# Patient Record
Sex: Female | Born: 1990 | Hispanic: No | Marital: Single | Smoking: Never smoker
Health system: Southern US, Community
[De-identification: ages and names within clinical notes are randomized; demographics above are authoritative.]

## PROBLEM LIST (undated history)

## (undated) DIAGNOSIS — D649 Anemia, unspecified: Secondary | ICD-10-CM

## (undated) HISTORY — PX: HERNIA REPAIR: SHX51

---
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
## 2013-04-12 ENCOUNTER — Encounter (HOSPITAL_COMMUNITY): Payer: Self-pay | Admitting: Emergency Medicine

## 2013-04-12 ENCOUNTER — Emergency Department (HOSPITAL_COMMUNITY): Payer: BC Managed Care – PPO

## 2013-04-12 ENCOUNTER — Emergency Department (HOSPITAL_COMMUNITY)
Admission: EM | Admit: 2013-04-12 | Discharge: 2013-04-12 | Disposition: A | Discharge status: Home / Self Care | Payer: BC Managed Care – PPO | Attending: Emergency Medicine | Admitting: Emergency Medicine

## 2013-04-12 DIAGNOSIS — R197 Diarrhea, unspecified: Secondary | ICD-10-CM | POA: Insufficient documentation

## 2013-04-12 DIAGNOSIS — R0602 Shortness of breath: Secondary | ICD-10-CM | POA: Insufficient documentation

## 2013-04-12 DIAGNOSIS — M549 Dorsalgia, unspecified: Secondary | ICD-10-CM | POA: Insufficient documentation

## 2013-04-12 DIAGNOSIS — R1033 Periumbilical pain: Secondary | ICD-10-CM | POA: Insufficient documentation

## 2013-04-12 DIAGNOSIS — Z9889 Other specified postprocedural states: Secondary | ICD-10-CM | POA: Insufficient documentation

## 2013-04-12 DIAGNOSIS — R109 Unspecified abdominal pain: Secondary | ICD-10-CM

## 2013-04-12 DIAGNOSIS — Z3202 Encounter for pregnancy test, result negative: Secondary | ICD-10-CM | POA: Insufficient documentation

## 2013-04-12 DIAGNOSIS — R112 Nausea with vomiting, unspecified: Secondary | ICD-10-CM

## 2013-04-12 LAB — COMPREHENSIVE METABOLIC PANEL
Albumin: 4 g/dL (ref 3.5–5.2)
Alkaline Phosphatase: 49 U/L (ref 39–117)
BUN: 9 mg/dL (ref 6–23)
CO2: 26 mEq/L (ref 19–32)
Chloride: 101 mEq/L (ref 96–112)
Creatinine, Ser: 0.69 mg/dL (ref 0.50–1.10)
GFR calc Af Amer: >90 mL/min (ref >90)
GFR calc non Af Amer: >90 mL/min (ref >90)
Glucose, Bld: 115 mg/dL — ABNORMAL HIGH (ref 70–99)
Potassium: 3.6 mEq/L (ref 3.5–5.1)
Total Bilirubin: 0.4 mg/dL (ref 0.3–1.2)

## 2013-04-12 LAB — CBC WITH DIFFERENTIAL/PLATELET
Basophils Relative: 1 % (ref 0–1)
HCT: 36.3 % (ref 36.0–46.0)
Hemoglobin: 13.1 g/dL (ref 12.0–15.0)
Lymphocytes Relative: 24 % (ref 12–46)
Lymphs Abs: 2.1 10*3/uL (ref 0.7–4.0)
MCHC: 36.1 g/dL — ABNORMAL HIGH (ref 30.0–36.0)
Monocytes Absolute: 0.5 10*3/uL (ref 0.1–1.0)
Monocytes Relative: 6 % (ref 3–12)
Neutro Abs: 6 10*3/uL (ref 1.7–7.7)
Neutrophils Relative %: 69 % (ref 43–77)
RBC: 4.64 MIL/uL (ref 3.87–5.11)

## 2013-04-12 LAB — URINALYSIS, ROUTINE W REFLEX MICROSCOPIC
Glucose, UA: NEGATIVE mg/dL
Ketones, ur: NEGATIVE mg/dL
Leukocytes, UA: NEGATIVE
Nitrite: NEGATIVE
Protein, ur: NEGATIVE mg/dL
Specific Gravity, Urine: 1.012 (ref 1.005–1.030)
pH: 8 (ref 5.0–8.0)

## 2013-04-12 LAB — LIPASE, BLOOD: Lipase: 35 U/L (ref 11–59)

## 2013-04-12 MED ORDER — ONDANSETRON HCL 4 MG/2ML IJ SOLN
4.0000 mg | INTRAMUSCULAR | Status: AC
  Administered 2013-04-12: 4 mg via INTRAVENOUS
  Filled 2013-04-12: qty 2

## 2013-04-12 MED ORDER — ONDANSETRON HCL 4 MG PO TABS: 4.0000 mg | ORAL_TABLET | Freq: Four times a day (QID) | ORAL | Status: DC

## 2013-04-12 MED ORDER — IOHEXOL 300 MG/ML  SOLN: 25.0000 mL | Freq: Once | INTRAMUSCULAR | Status: DC | PRN

## 2013-04-12 MED ORDER — IOHEXOL 300 MG/ML  SOLN
100.0000 mL | Freq: Once | INTRAMUSCULAR | Status: AC | PRN
  Administered 2013-04-12: 100 mL via INTRAVENOUS

## 2013-04-12 MED ORDER — SODIUM CHLORIDE 0.9 % IV BOLUS (SEPSIS)
1000.0000 mL | Freq: Once | INTRAVENOUS | Status: AC
  Administered 2013-04-12: 1000 mL via INTRAVENOUS

## 2013-04-12 MED ORDER — MORPHINE SULFATE 4 MG/ML IJ SOLN
4.0000 mg | Freq: Once | INTRAMUSCULAR | Status: AC
  Administered 2013-04-12: 4 mg via INTRAVENOUS
  Filled 2013-04-12: qty 1

## 2013-04-12 MED ORDER — HYDROMORPHONE HCL PF 1 MG/ML IJ SOLN
1.0000 mg | Freq: Once | INTRAMUSCULAR | Status: AC
  Administered 2013-04-12: 1 mg via INTRAVENOUS
  Filled 2013-04-12: qty 1

## 2013-04-12 MED ORDER — HYDROCODONE-ACETAMINOPHEN 5-325 MG PO TABS: 1.0000 | ORAL_TABLET | Freq: Four times a day (QID) | ORAL | Status: DC | PRN

## 2013-04-12 NOTE — Progress Notes (Signed)
Patient with cough following injection of IV contrast for CT examination. I went to see the patient. She was in no acute distress and breathing normally. She stated that she was now feeling as she did prior to contrast injection. Pulse was 75 bpm. No hives identified. 50 mg of Benadryl IV was ordered and the patient was sent back to the ED.

## 2013-04-12 NOTE — ED Notes (Signed)
Pt. States that around 2200 pm her stomach started hurting then she woke up at 0300 with N/V/D. Vomited X 3 since that time. Diarrhea X 3 since that time. Abdomen feels like it is having a burning sensation.

## 2013-04-12 NOTE — ED Notes (Signed)
Pt ambulated to restroom. 

## 2013-04-12 NOTE — ED Notes (Addendum)
I/v D/c 

## 2013-04-12 NOTE — ED Provider Notes (Signed)
CSN: [id number]     Arrival date & time 04/12/13  4098 History   First MD Initiated Contact with Patient 04/12/13 0645     Chief Complaint  Patient presents with  . Abdominal Pain  . Nausea  . Emesis   (Consider location/radiation/quality/duration/timing/severity/associated sxs/prior Treatment) HPI Comments: Patient is a 22 year old female with a history of umbilical hernia repair at age 32 who presents for abdominal pain with onset yesterday. Patient states the pain began periumbilically and is burning and sharp in nature. Patient states the pain has worsened since onset without any modifying factors. Patient awoke 4 hours ago with sudden onset of nausea and nonbloody, nonbilious emesis x3. Patient also admits to 3 episodes of watery, nonbloody diarrhea. Patient states she does feel slightly short of breath, but is without dyspnea or tachypnea on initial presentation. She also c/o associated burning pain across her low back. She denies associated fever, chest pain, urinary symptoms, melena or hematochezia, vaginal complaints, and numbness or tingling. Patient's last menstrual period was 03/20/2013. She denies a history of abdominal surgeries. Patient is not sexually active.  Patient is a 22 y.o. female presenting with abdominal pain and vomiting. The history is provided by the patient. No language interpreter was used.  Abdominal Pain Associated symptoms: diarrhea, nausea, shortness of breath and vomiting   Associated symptoms: no chest pain, no dysuria, no fever, no hematuria, no vaginal bleeding and no vaginal discharge   Emesis Associated symptoms: abdominal pain and diarrhea     History reviewed. No pertinent past medical history. Past Surgical History  Procedure Laterality Date  . Hernia repair     No family history on file. History  Substance Use Topics  . Smoking status: Never Smoker   . Smokeless tobacco: Not on file  . Alcohol Use: No   OB History   Grav Para Term Preterm  Abortions TAB SAB Ect Mult Living                 Review of Systems  Constitutional: Negative for fever.  Respiratory: Positive for shortness of breath.   Cardiovascular: Negative for chest pain.  Gastrointestinal: Positive for nausea, vomiting, abdominal pain and diarrhea.  Genitourinary: Negative for dysuria, hematuria, vaginal bleeding and vaginal discharge.  Musculoskeletal: Positive for back pain.  Neurological: Negative for numbness.  All other systems reviewed and are negative.    Allergies  Iohexol  Home Medications   Current Outpatient Rx  Name  Route  Sig  Dispense  Refill  . OVER THE COUNTER MEDICATION   Oral   Take 1 tablet by mouth daily. Allergy medication         . HYDROcodone-acetaminophen (NORCO/VICODIN) 5-325 MG per tablet   Oral   Take 1 tablet by mouth every 6 (six) hours as needed for pain.   7 tablet   0   . ondansetron (ZOFRAN) 4 MG tablet   Oral   Take 1 tablet (4 mg total) by mouth every 6 (six) hours.   12 tablet   0    BP 146/90  Pulse 63  Temp(Src) 97.4 F (36.3 C) (Oral)  Resp 22  Ht 5' 7.5" (1.715 m)  SpO2 100%  LMP 03/26/2013  Physical Exam  Nursing note and vitals reviewed. Constitutional: She is oriented to person, place, and time. She appears well-developed and well-nourished. No distress.  HENT:  Head: Normocephalic and atraumatic.  Eyes: Conjunctivae and EOM are normal. Pupils are equal, round, and reactive to light. No scleral icterus.  Neck: Normal range of motion.  Cardiovascular: Normal rate, regular rhythm, normal heart sounds and intact distal pulses.   Pulmonary/Chest: Effort normal. No respiratory distress. She has no wheezes. She has no rales.  Abdominal: Soft. Normal appearance. She exhibits no distension, no ascites, no pulsatile midline mass and no mass. There is tenderness in the periumbilical area. There is guarding (voluntary) and tenderness at McBurney's point. There is no rebound and negative Murphy's  sign.    No peritoneal signs or evidence of acute surgical abdomen.  Musculoskeletal: Normal range of motion.  Neurological: She is alert and oriented to person, place, and time.  Skin: Skin is warm and dry. No rash noted. She is not diaphoretic. No erythema. No pallor.  Psychiatric: She has a normal mood and affect. Her behavior is normal.    ED Course  Procedures (including critical care time) Labs Review Labs Reviewed  CBC WITH DIFFERENTIAL - Abnormal; Notable for the following:    MCHC 36.1 (*)    All other components within normal limits  COMPREHENSIVE METABOLIC PANEL - Abnormal; Notable for the following:    Glucose, Bld 115 (*)    All other components within normal limits  URINALYSIS, ROUTINE W REFLEX MICROSCOPIC  LIPASE, BLOOD  POCT PREGNANCY, URINE   Imaging Review Ct Abdomen Pelvis W Contrast  04/12/2013   CLINICAL DATA:  Periumbilical and right lower quadrant abdominal pain.  EXAM: CT ABDOMEN AND PELVIS WITH CONTRAST  TECHNIQUE: Multidetector CT imaging of the abdomen and pelvis was performed using the standard protocol following bolus administration of intravenous contrast.  CONTRAST:  OMNIPAQUE IOHEXOL 300 MG/ML  SOLN.  Patient did experience some coughing and difficulty taking a deep breath following IV contrast injection.  COMPARISON:  None.  FINDINGS: Lung bases are clear.  No pericardial fluid.  No focal hepatic lesion. There are multiple cholesterol gallstones within the gallbladder which extend to the gallbladder neck (image 29). There are least 6 calculi observed measuring from 5-7 mm. Smaller stones may be be occult by CT. No intrahepatic biliary duct dilatation. The common bile duct normal caliber. The pancreas is normal.  The spleen, adrenal glands, and kidneys are normal. The right kidney is normal. 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 No ureterolithiasis.  The stomach, small bowel, and cecum are normal. The appendix is normal. The colon and rectosigmoid colon are  normal.  Abdominal aorta is normal caliber. No retroperitoneal periportal lymphadenopathy.  No free fluid the pelvis. History renal stones or bladder stones. The uterus and ovaries are normal. No pelvic lymphadenopathy. Insert negative then  IMPRESSION: 1. Multiple gallstones within the gallbladder lumen without evidence of acute cholecystitis.  2. Normal appendix.  3.  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   Electronically Signed   By: Genevive Bi M.D.   On: 04/12/2013 09:07    MDM   1. Abdominal pain   2. Nausea vomiting and diarrhea    22 year old female with no significant past medical history presents for abdominal pain with associated nausea, vomiting, and diarrhea. Patient with generalized tenderness on physical exam, but focal tenderness periumbilically and in the right lower quadrant. She is afebrile and hemodynamically stable. No peritoneal signs or evidence of acute surgical abdomen. No symptoms likely viral, will obtain CT abdomen and pelvis to evaluate for appendicitis given focal tenderness on exam. Zofran, morphine, and IV fluids ordered for symptomatic management.  CT scan significant for gallstones without evidence of acute cholecystitis. Normal appendix and adnexa. Patient without leukocytosis, anemia, or electrolyte  imbalance. Liver and kidney function preserved and urinalysis nonsuggestive of infection. Urine pregnancy negative. Have reviewed these findings with the patient who verbalizes understanding. Patient endorses improvement in her abdominal pain since arrival. She has tolerated oral contrast in ED without emesis. Believe symptoms to be associated with a viral illness. Patient is appropriate for discharge with prescriptions for Zofran and Norco to take for symptoms as well as primary care followup. Return precautions discussed with the patient who verbalizes comfort and understanding with this discharge plan.  Antony Madura, PA-C 04/13/13 1652

## 2013-04-12 NOTE — ED Notes (Signed)
Patient transported to CT 

## 2013-04-12 NOTE — ED Notes (Signed)
Pt finished drinking contrast. CT Scan made aware.

## 2013-04-14 NOTE — ED Provider Notes (Signed)
Medical screening examination/treatment/procedure(s) were performed by non-physician practitioner and as supervising physician I was immediately available for consultation/collaboration.  Doug Sou, MD 04/14/13 [phone]

## 2013-12-10 ENCOUNTER — Encounter (INDEPENDENT_AMBULATORY_CARE_PROVIDER_SITE_OTHER): Payer: Self-pay

## 2013-12-10 ENCOUNTER — Ambulatory Visit (INDEPENDENT_AMBULATORY_CARE_PROVIDER_SITE_OTHER): Payer: BC Managed Care – PPO | Admitting: Family Medicine

## 2013-12-10 VITALS — BP 141/93 | HR 84 | Temp 98.0°F | Wt 228.6 lb

## 2013-12-10 DIAGNOSIS — J029 Acute pharyngitis, unspecified: Secondary | ICD-10-CM

## 2013-12-10 DIAGNOSIS — H109 Unspecified conjunctivitis: Secondary | ICD-10-CM

## 2013-12-10 MED ORDER — AMOXICILLIN 875 MG PO TABS: 875.0000 mg | ORAL_TABLET | Freq: Two times a day (BID) | ORAL | Status: DC

## 2013-12-10 MED ORDER — SULFACETAMIDE SODIUM 10 % OP SOLN: 1.0000 [drp] | OPHTHALMIC | Status: DC

## 2013-12-10 NOTE — Progress Notes (Signed)
   Subjective:    Patient ID: Marisa Suarez, female    DOB: [birth year], 23 y.o.   MRN: [id number]  HPI This 23 y.o. female presents for evaluation of sore throat and left eye infection.   Review of Systems C/o left eye infection and sore throat No chest pain, SOB, HA, dizziness, vision change, N/V, diarrhea, constipation, dysuria, urinary urgency or frequency, myalgias, arthralgias or rash.     Objective:   Physical Exam  Vital signs noted  Well developed well nourished female.  HEENT - Head atraumatic Normocephalic                Eyes - PERRLA, Conjuctiva - injected left eye Sclera- injected OS EOMI                Ears - EAC's Wnl TM's Wnl Gross Hearing WNL                Throat - oropharanx injected with exudates Respiratory - Lungs CTA bilateral Cardiac - RRR S1 and S2 without murmur GI - Abdomen soft Nontender and bowel sounds active x 4       Assessment & Plan:  Conjunctivitis - Plan: sulfacetamide (BLEPH-10) 10 % ophthalmic solution  Acute pharyngitis - Plan: amoxicillin (AMOXIL) 875 MG tablet po bid x 10 days #20  Push po fluids, rest, tylenol and motrin otc prn as directed for fever, arthralgias, and myalgias.  Follow up prn if sx's continue or persist.  Deatra Canter FNP

## 2014-09-17 ENCOUNTER — Ambulatory Visit (INDEPENDENT_AMBULATORY_CARE_PROVIDER_SITE_OTHER): Payer: BLUE CROSS/BLUE SHIELD | Admitting: Family

## 2014-09-17 VITALS — BP 144/86 | HR 90 | Temp 97.0°F | Ht 67.5 in | Wt 229.0 lb

## 2014-09-17 DIAGNOSIS — A499 Bacterial infection, unspecified: Secondary | ICD-10-CM | POA: Diagnosis not present

## 2014-09-17 DIAGNOSIS — N9489 Other specified conditions associated with female genital organs and menstrual cycle: Secondary | ICD-10-CM | POA: Diagnosis not present

## 2014-09-17 DIAGNOSIS — B9689 Other specified bacterial agents as the cause of diseases classified elsewhere: Secondary | ICD-10-CM

## 2014-09-17 DIAGNOSIS — N898 Other specified noninflammatory disorders of vagina: Secondary | ICD-10-CM | POA: Diagnosis not present

## 2014-09-17 DIAGNOSIS — N760 Acute vaginitis: Secondary | ICD-10-CM

## 2014-09-17 LAB — POCT WET PREP WITH KOH
TRICHOMONAS UA: NEGATIVE
Yeast Wet Prep HPF POC: NEGATIVE

## 2014-09-17 MED ORDER — METRONIDAZOLE 500 MG PO TABS: 500.0000 mg | ORAL_TABLET | Freq: Two times a day (BID) | ORAL | Status: DC

## 2014-09-17 NOTE — Patient Instructions (Addendum)
Bacterial Vaginosis Bacterial vaginosis is a vaginal infection that occurs when the normal balance of bacteria in the vagina is disrupted. It results from an overgrowth of certain bacteria. This is the most common vaginal infection in women of childbearing age. Treatment is important to prevent complications, especially in pregnant women, as it can cause a premature delivery. CAUSES  Bacterial vaginosis is caused by an increase in harmful bacteria that are normally present in smaller amounts in the vagina. Several different kinds of bacteria can cause bacterial vaginosis. However, the reason that the condition develops is not fully understood. RISK FACTORS Certain activities or behaviors can put you at an increased risk of developing bacterial vaginosis, including:  Having a new sex partner or multiple sex partners.  Douching.  Using an intrauterine device (IUD) for contraception. Women do not get bacterial vaginosis from toilet seats, bedding, swimming pools, or contact with objects around them. SIGNS AND SYMPTOMS  Some women with bacterial vaginosis have no signs or symptoms. Common symptoms include:  Grey vaginal discharge.  A fishlike odor with discharge, especially after sexual intercourse.  Itching or burning of the vagina and vulva.  Burning or pain with urination. DIAGNOSIS  Your health care provider will take a medical history and examine the vagina for signs of bacterial vaginosis. A sample of vaginal fluid may be taken. Your health care provider will look at this sample under a microscope to check for bacteria and abnormal cells. A vaginal pH test may also be done.  TREATMENT  Bacterial vaginosis may be treated with antibiotic medicines. These may be given in the form of a pill or a vaginal cream. A second round of antibiotics may be prescribed if the condition comes back after treatment.  HOME CARE INSTRUCTIONS   Only take over-the-counter or prescription medicines as  directed by your health care provider.  If antibiotic medicine was prescribed, take it as directed. Make sure you finish it even if you start to feel better.  Do not have sex until treatment is completed.  Tell all sexual partners that you have a vaginal infection. They should see their health care provider and be treated if they have problems, such as a mild rash or itching.  Practice safe sex by using condoms and only having one sex partner. SEEK MEDICAL CARE IF:   Your symptoms are not improving after 3 days of treatment.  You have increased discharge or pain.  You have a fever. MAKE SURE YOU:   Understand these instructions.  Will watch your condition.  Will get help right away if you are not doing well or get worse. FOR MORE INFORMATION  Centers for Disease Control and Prevention, Division of STD Prevention: www.cdc.gov/std American Sexual Health Association (ASHA): www.ashastd.org  Document Released: 07/01/2005 Document Revised: 04/21/2013 Document Reviewed: 02/10/2013 Exit[employer] Patient Information 2015 [employer]. This information is not intended to replace advice given to you by your health care provider. Make sure you discuss any questions you have with your health care provider.  

## 2014-09-17 NOTE — Progress Notes (Signed)
   Subjective:    Patient ID: Marisa Suarez, female    DOB: 01/10/1991, 24 y.o.   MRN: [id number]  Vaginal Discharge The patient's primary symptoms include a genital odor and vaginal discharge. The patient's pertinent negatives include no genital itching, genital lesions, genital rash or pelvic pain. This is a new problem. The current episode started in the past 7 days. The problem occurs intermittently. The problem has been waxing and waning. The pain is moderate. Pertinent negatives include no back pain, chills, constipation, dysuria, flank pain, frequency, headaches, hematuria, nausea or sore throat. The vaginal discharge was yellow. There has been no bleeding. She has tried nothing for the symptoms. The treatment provided no relief.      Review of Systems  Constitutional: Negative.  Negative for chills.  HENT: Negative.  Negative for sore throat.   Eyes: Negative.   Respiratory: Negative.  Negative for shortness of breath.   Cardiovascular: Negative.  Negative for palpitations.  Gastrointestinal: Negative.  Negative for nausea and constipation.  Endocrine: Negative.   Genitourinary: Positive for vaginal discharge. Negative for dysuria, frequency, hematuria, flank pain and pelvic pain.  Musculoskeletal: Negative.  Negative for back pain.  Neurological: Negative.  Negative for headaches.  Hematological: Negative.   Psychiatric/Behavioral: Negative.   All other systems reviewed and are negative.      Objective:   Physical Exam  Constitutional: She is oriented to person, place, and time. She appears well-developed and well-nourished. No distress.  HENT:  Head: Normocephalic and atraumatic.  Right Ear: External ear normal.  Mouth/Throat: Oropharynx is clear and moist.  Eyes: Pupils are equal, round, and reactive to light.  Neck: Normal range of motion. Neck supple. No thyromegaly present.  Cardiovascular: Normal rate, regular rhythm, normal heart sounds and intact distal pulses.     No murmur heard. Pulmonary/Chest: Effort normal and breath sounds normal. No respiratory distress. She has no wheezes.  Abdominal: Soft. Bowel sounds are normal. She exhibits no distension. There is no tenderness.  Musculoskeletal: Normal range of motion. She exhibits no edema or tenderness.  Neurological: She is alert and oriented to person, place, and time. She has normal reflexes. No cranial nerve deficit.  Skin: Skin is warm and dry.  Psychiatric: She has a normal mood and affect. Her behavior is normal. Judgment and thought content normal.  Vitals reviewed.    BP 144/86 mmHg  Pulse 90  Temp(Src) 97 F (36.1 C) (Oral)  Ht 5' 7.5" (1.715 m)  Wt 229 lb (103.874 kg)  BMI 35.32 kg/m2  LMP 09/10/2014 (Exact Date)      Assessment & Plan:  1. Vaginal odor - POCT Wet Prep with KOH  2. Vaginal discharge - POCT Wet Prep with KOH  3. BV (bacterial vaginosis) -Keep area clean and dry -Cotton underwear -RTO prn - metroNIDAZOLE (FLAGYL) 500 MG tablet; Take 1 tablet (500 mg total) by mouth 2 (two) times daily.  Dispense: 14 tablet; Refill: 0  Jannifer Rodneyhristy Hawks, FNP

## 2014-12-10 ENCOUNTER — Ambulatory Visit (INDEPENDENT_AMBULATORY_CARE_PROVIDER_SITE_OTHER): Payer: BLUE CROSS/BLUE SHIELD | Admitting: Family Medicine

## 2014-12-10 VITALS — BP 123/83 | HR 74 | Temp 97.3°F | Ht 67.0 in | Wt 230.0 lb

## 2014-12-10 DIAGNOSIS — Z202 Contact with and (suspected) exposure to infections with a predominantly sexual mode of transmission: Secondary | ICD-10-CM | POA: Diagnosis not present

## 2014-12-10 DIAGNOSIS — R300 Dysuria: Secondary | ICD-10-CM

## 2014-12-10 LAB — POCT URINALYSIS DIPSTICK
Bilirubin, UA: NEGATIVE
Blood, UA: NEGATIVE
Glucose, UA: NEGATIVE
KETONES UA: NEGATIVE
Leukocytes, UA: NEGATIVE
Nitrite, UA: NEGATIVE
Spec Grav, UA: >=1.03
UROBILINOGEN UA: NEGATIVE
pH, UA: 5

## 2014-12-10 LAB — POCT UA - MICROSCOPIC ONLY
Casts, Ur, LPF, POC: NEGATIVE
Crystals, Ur, HPF, POC: NEGATIVE
Yeast, UA: NEGATIVE

## 2014-12-10 MED ORDER — CEFTRIAXONE SODIUM 1 G IJ SOLR
1.0000 g | Freq: Once | INTRAMUSCULAR | Status: AC
  Administered 2014-12-10: 1 g via INTRAMUSCULAR

## 2014-12-10 MED ORDER — CIPROFLOXACIN HCL 500 MG PO TABS: 500.0000 mg | ORAL_TABLET | Freq: Two times a day (BID) | ORAL | Status: DC

## 2014-12-10 NOTE — Progress Notes (Signed)
Subjective:  Patient ID: Marisa Suarez, female    DOB: [birth year]  Age: 24 y.o. MRN: [id number]  CC: Vaginal burning   HPI [name] presents for frequent burning sensation for the last week. This has been accompanied by some odor and discharge. She was recently tested for BV and found to have clue cells. This was approximately 3 months ago. She had a short time without symptoms but they have recurred over the last 1-2 weeks. She does have some urinary discomfort but no frequency or urgency.  Sexually she had 1 partner just prior to her original presentation 3 months ago. She has not been sexually active since that time.  History Teyona has no past medical history on file.   She has past surgical history that includes Hernia repair.   Her family history is not on file.She reports that she has never smoked. She does not have any smokeless tobacco history on file. She reports that she does not drink alcohol or use illicit drugs.  Outpatient Prescriptions Prior to Visit  Medication Sig Dispense Refill  . metroNIDAZOLE (FLAGYL) 500 MG tablet Take 1 tablet (500 mg total) by mouth 2 (two) times daily. 14 tablet 0   No facility-administered medications prior to visit.    ROS Review of Systems  Constitutional: Negative for fever, chills, diaphoresis, appetite change and fatigue.  HENT: Negative for congestion, ear pain, hearing loss, postnasal drip, rhinorrhea, sore throat and trouble swallowing.   Respiratory: Negative for cough, chest tightness and shortness of breath.   Cardiovascular: Negative for chest pain and palpitations.  Gastrointestinal: Negative for abdominal pain.  Genitourinary: Positive for dysuria, vaginal discharge and vaginal pain. Negative for frequency, flank pain and genital sores.  Musculoskeletal: Negative for arthralgias.  Skin: Negative for rash.    Objective:  BP 123/83 mmHg  Pulse 74  Temp(Src) 97.3 F (36.3 C) (Oral)  Ht  (1.702 m)  Wt 230 lb  (104.327 kg)  BMI 36.01 kg/m2  BP Readings from Last 3 Encounters:  12/10/14 123/83  09/17/14 144/86  12/10/13 141/93    Wt Readings from Last 3 Encounters:  12/10/14 230 lb (104.327 kg)  09/17/14 229 lb (103.874 kg)  12/10/13 228 lb 9.6 oz (103.692 kg)     Physical Exam  Constitutional: She is oriented to person, place, and time. She appears well-developed and well-nourished. No distress.  HENT:  Head: Normocephalic and atraumatic.  Eyes: Conjunctivae are normal. Pupils are equal, round, and reactive to light.  Neck: Normal range of motion. Neck supple.  Cardiovascular: Normal rate, regular rhythm and normal heart sounds.   No murmur heard. Pulmonary/Chest: Effort normal and breath sounds normal. No respiratory distress. She has no wheezes. She has no rales.  Neurological: She is alert and oriented to person, place, and time. She has normal reflexes.  Skin: Skin is warm and dry. No erythema.  Psychiatric: She has a normal mood and affect. Her behavior is normal. Judgment and thought content normal.  Vitals reviewed.   No results found for: HGBA1C  Lab Results  Component Value Date   WBC 8.8 04/12/2013   HGB 13.1 04/12/2013   HCT 36.3 04/12/2013   PLT 295 04/12/2013   GLUCOSE 115* 04/12/2013   ALT 10 04/12/2013   AST 14 04/12/2013   NA 139 04/12/2013   K 3.6 04/12/2013   CL 101 04/12/2013   CREATININE 0.69 04/12/2013   BUN 9 04/12/2013   CO2 26 04/12/2013   Results for orders placed or  performed in visit on 12/10/14  POCT urinalysis dipstick  Result Value Ref Range   Color, UA drkyellow    Clarity, UA cloudy    Glucose, UA neg    Bilirubin, UA neg    Ketones, UA neg    Spec Grav, UA >=1.030    Blood, UA neg    pH, UA 5.0    Protein, UA trc    Urobilinogen, UA negative    Nitrite, UA neg    Leukocytes, UA Negative   POCT UA - Microscopic Only  Result Value Ref Range   WBC, Ur, HPF, POC occ    RBC, urine, microscopic occ    Bacteria, U Microscopic  many    Mucus, UA mod    Epithelial cells, urine per micros few    Crystals, Ur, HPF, POC neg    Casts, Ur, LPF, POC neg    Yeast, UA neg     Ct Abdomen Pelvis W Contrast  04/12/2013   CLINICAL DATA:  Periumbilical and right lower quadrant abdominal pain.  EXAM: CT ABDOMEN AND PELVIS WITH CONTRAST  TECHNIQUE: Multidetector CT imaging of the abdomen and pelvis was performed using the standard protocol following bolus administration of intravenous contrast.  CONTRAST:  100mL OMNIPAQUE IOHEXOL 300 MG/ML  SOLN.  Patient did experience some coughing and difficulty taking a deep breath following IV contrast injection.  COMPARISON:  None.  FINDINGS: Lung bases are clear.  No pericardial fluid.  No focal hepatic lesion. There are multiple cholesterol gallstones within the gallbladder which extend to the gallbladder neck (image 29). There are least 6 calculi observed measuring from 5-7 mm. Smaller stones may be be occult by CT. No intrahepatic biliary duct dilatation. The common bile duct normal caliber. The pancreas is normal.  The spleen, adrenal glands, and kidneys are normal. The right kidney is normal. 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 No ureterolithiasis.  The stomach, small bowel, and cecum are normal. The appendix is normal. The colon and rectosigmoid colon are normal.  Abdominal aorta is normal caliber. No retroperitoneal periportal lymphadenopathy.  No free fluid the pelvis. History renal stones or bladder stones. The uterus and ovaries are normal. No pelvic lymphadenopathy. Insert negative then  IMPRESSION: 1. Multiple gallstones within the gallbladder lumen without evidence of acute cholecystitis.  2. Normal appendix.  3.  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   Electronically Signed   By: Genevive BiStewart  Edmunds M.D.   On: 04/12/2013 09:07    Assessment & Plan:   Raoul PitchSierra was seen today for vaginal burning.  Diagnoses and all orders for this visit:  Dysuria Orders: -     POCT urinalysis dipstick -      POCT UA - Microscopic Only -     cefTRIAXone (ROCEPHIN) injection 1 g; Inject 1 g into the muscle once. -     GC/Chlamydia Probe Amp -     RPR -     HIV antibody  Potential exposure to STD Orders: -     cefTRIAXone (ROCEPHIN) injection 1 g; Inject 1 g into the muscle once. -     GC/Chlamydia Probe Amp -     RPR -     HIV antibody  Other orders -     ciprofloxacin (CIPRO) 500 MG tablet; Take 1 tablet (500 mg total) by mouth 2 (two) times daily.   I have discontinued Ms. Gonzales's metroNIDAZOLE. I am also having her start on ciprofloxacin. We administered cefTRIAXone.  Meds ordered this encounter  Medications  .  cefTRIAXone (ROCEPHIN) injection 1 g    Sig:   . ciprofloxacin (CIPRO) 500 MG tablet    Sig: Take 1 tablet (500 mg total) by mouth 2 (two) times daily.    Dispense:  20 tablet    Refill:  0     Follow-up: Return if symptoms worsen or fail to improve.  Mechele Claude, M.D.

## 2014-12-11 LAB — RPR: RPR Ser Ql: NONREACTIVE

## 2014-12-11 LAB — HIV ANTIBODY (ROUTINE TESTING W REFLEX): HIV Screen 4th Generation wRfx: NONREACTIVE

## 2014-12-13 LAB — GC/CHLAMYDIA PROBE AMP
Chlamydia trachomatis, NAA: NEGATIVE
Neisseria gonorrhoeae by PCR: NEGATIVE

## 2014-12-15 ENCOUNTER — Encounter (HOSPITAL_COMMUNITY): Payer: Self-pay | Admitting: Emergency Medicine

## 2014-12-15 ENCOUNTER — Emergency Department (HOSPITAL_COMMUNITY)
Admission: EM | Admit: 2014-12-15 | Discharge: 2014-12-15 | Disposition: A | Discharge status: Home / Self Care | Payer: BLUE CROSS/BLUE SHIELD | Attending: Emergency Medicine | Admitting: Emergency Medicine

## 2014-12-15 ENCOUNTER — Emergency Department (HOSPITAL_COMMUNITY): Payer: BLUE CROSS/BLUE SHIELD

## 2014-12-15 DIAGNOSIS — R197 Diarrhea, unspecified: Secondary | ICD-10-CM | POA: Diagnosis not present

## 2014-12-15 DIAGNOSIS — K8020 Calculus of gallbladder without cholecystitis without obstruction: Secondary | ICD-10-CM | POA: Insufficient documentation

## 2014-12-15 DIAGNOSIS — E669 Obesity, unspecified: Secondary | ICD-10-CM | POA: Insufficient documentation

## 2014-12-15 DIAGNOSIS — K8050 Calculus of bile duct without cholangitis or cholecystitis without obstruction: Secondary | ICD-10-CM

## 2014-12-15 DIAGNOSIS — Z3202 Encounter for pregnancy test, result negative: Secondary | ICD-10-CM | POA: Insufficient documentation

## 2014-12-15 DIAGNOSIS — R630 Anorexia: Secondary | ICD-10-CM | POA: Insufficient documentation

## 2014-12-15 DIAGNOSIS — R109 Unspecified abdominal pain: Secondary | ICD-10-CM | POA: Diagnosis present

## 2014-12-15 LAB — CBC WITH DIFFERENTIAL/PLATELET
BASOS PCT: 1 % (ref 0–1)
Basophils Absolute: 0.1 10*3/uL (ref 0.0–0.1)
Eosinophils Absolute: 0.4 10*3/uL (ref 0.0–0.7)
Eosinophils Relative: 5 % (ref 0–5)
HCT: 37.2 % (ref 36.0–46.0)
Hemoglobin: 12.9 g/dL (ref 12.0–15.0)
Lymphocytes Relative: 33 % (ref 12–46)
Lymphs Abs: 2.9 10*3/uL (ref 0.7–4.0)
MCH: 27.8 pg (ref 26.0–34.0)
MCHC: 34.7 g/dL (ref 30.0–36.0)
MCV: 80.2 fL (ref 78.0–100.0)
MONO ABS: 0.5 10*3/uL (ref 0.1–1.0)
Monocytes Relative: 6 % (ref 3–12)
NEUTROS ABS: 4.8 10*3/uL (ref 1.7–7.7)
Neutrophils Relative %: 55 % (ref 43–77)
Platelets: 303 10*3/uL (ref 150–400)
RBC: 4.64 MIL/uL (ref 3.87–5.11)
RDW: 13.3 % (ref 11.5–15.5)
WBC: 8.6 10*3/uL (ref 4.0–10.5)

## 2014-12-15 LAB — URINALYSIS, ROUTINE W REFLEX MICROSCOPIC
BILIRUBIN URINE: NEGATIVE
Glucose, UA: NEGATIVE mg/dL
HGB URINE DIPSTICK: NEGATIVE
Ketones, ur: NEGATIVE mg/dL
LEUKOCYTES UA: NEGATIVE
NITRITE: NEGATIVE
Protein, ur: NEGATIVE mg/dL
Urobilinogen, UA: 0.2 mg/dL (ref 0.0–1.0)
pH: 6 (ref 5.0–8.0)

## 2014-12-15 LAB — COMPREHENSIVE METABOLIC PANEL
ALBUMIN: 4 g/dL (ref 3.5–5.0)
ALT: 15 U/L (ref 14–54)
AST: 17 U/L (ref 15–41)
Alkaline Phosphatase: 46 U/L (ref 38–126)
Anion gap: 10 (ref 5–15)
BUN: 12 mg/dL (ref 6–20)
CALCIUM: 9 mg/dL (ref 8.9–10.3)
CO2: 27 mmol/L (ref 22–32)
CREATININE: 0.77 mg/dL (ref 0.44–1.00)
Chloride: 102 mmol/L (ref 101–111)
GFR calc Af Amer: >60 mL/min (ref >60)
GFR calc non Af Amer: >60 mL/min (ref >60)
Glucose, Bld: 102 mg/dL — ABNORMAL HIGH (ref 65–99)
Potassium: 3.9 mmol/L (ref 3.5–5.1)
SODIUM: 139 mmol/L (ref 135–145)
Total Bilirubin: 0.6 mg/dL (ref 0.3–1.2)
Total Protein: 7.4 g/dL (ref 6.5–8.1)

## 2014-12-15 LAB — PREGNANCY, URINE: Preg Test, Ur: NEGATIVE

## 2014-12-15 LAB — LIPASE, BLOOD: LIPASE: 38 U/L (ref 22–51)

## 2014-12-15 MED ORDER — ONDANSETRON HCL 4 MG/2ML IJ SOLN
4.0000 mg | Freq: Once | INTRAMUSCULAR | Status: AC
  Administered 2014-12-15: 4 mg via INTRAVENOUS
  Filled 2014-12-15: qty 2

## 2014-12-15 MED ORDER — FENTANYL CITRATE (PF) 100 MCG/2ML IJ SOLN
50.0000 ug | Freq: Once | INTRAMUSCULAR | Status: AC
  Administered 2014-12-15: 50 ug via INTRAVENOUS
  Filled 2014-12-15: qty 2

## 2014-12-15 MED ORDER — OXYCODONE-ACETAMINOPHEN 5-325 MG PO TABS
1.0000 | ORAL_TABLET | Freq: Once | ORAL | Status: AC
Start: 2014-12-15 — End: 2014-12-15
  Administered 2014-12-15: 1 via ORAL
  Filled 2014-12-15: qty 1

## 2014-12-15 MED ORDER — SODIUM CHLORIDE 0.9 % IV BOLUS (SEPSIS)
500.0000 mL | Freq: Once | INTRAVENOUS | Status: AC
  Administered 2014-12-15: 500 mL via INTRAVENOUS

## 2014-12-15 NOTE — Discharge Instructions (Signed)
Biliary Colic  °Biliary colic is a steady or irregular pain in the upper abdomen. It is usually under the right side of the rib cage. It happens when gallstones interfere with the normal flow of bile from the gallbladder. Bile is a liquid that helps to digest fats. Bile is made in the liver and stored in the gallbladder. When you eat a meal, bile passes from the gallbladder through the cystic duct and the common bile duct into the small intestine. There, it mixes with partially digested food. If a gallstone blocks either of these ducts, the normal flow of bile is blocked. The muscle cells in the bile duct contract forcefully to try to move the stone. This causes the pain of biliary colic.  °SYMPTOMS  °· A person with biliary colic usually complains of pain in the upper abdomen. This pain can be: °¨ In the center of the upper abdomen just below the breastbone. °¨ In the upper-right part of the abdomen, near the gallbladder and liver. °¨ Spread back toward the right shoulder blade. °· Nausea and vomiting. °· The pain usually occurs after eating. °· Biliary colic is usually triggered by the digestive system's demand for bile. The demand for bile is high after fatty meals. Symptoms can also occur when a person who has been fasting suddenly eats a very large meal. Most episodes of biliary colic pass after 1 to 5 hours. After the most intense pain passes, your abdomen may continue to ache mildly for about 24 hours. °DIAGNOSIS  °After you describe your symptoms, your caregiver will perform a physical exam. He or she will pay attention to the upper right portion of your belly (abdomen). This is the area of your liver and gallbladder. An ultrasound will help your caregiver look for gallstones. Specialized scans of the gallbladder may also be done. Blood tests may be done, especially if you have fever or if your pain persists. °PREVENTION  °Biliary colic can be prevented by controlling the risk factors for gallstones. Some of  these risk factors, such as heredity, increasing age, and pregnancy are a normal part of life. Obesity and a high-fat diet are risk factors you can change through a healthy lifestyle. Women going through menopause who take hormone replacement therapy (estrogen) are also more likely to develop biliary colic. °TREATMENT  °· Pain medication may be prescribed. °· You may be encouraged to eat a fat-free diet. °· If the first episode of biliary colic is severe, or episodes of colic keep retuning, surgery to remove the gallbladder (cholecystectomy) is usually recommended. This procedure can be done through small incisions using an instrument called a laparoscope. The procedure often requires a brief stay in the hospital. Some people can leave the hospital the same day. It is the most widely used treatment in people troubled by painful gallstones. It is effective and safe, with no complications in more than 90% of cases. °· If surgery cannot be done, medication that dissolves gallstones may be used. This medication is expensive and can take months or years to work. Only small stones will dissolve. °· Rarely, medication to dissolve gallstones is combined with a procedure called shock-wave lithotripsy. This procedure uses carefully aimed shock waves to break up gallstones. In many people treated with this procedure, gallstones form again within a few years. °PROGNOSIS  °If gallstones block your cystic duct or common bile duct, you are at risk for repeated episodes of biliary colic. There is also a 25% chance that you will develop   a gallbladder infection(acute cholecystitis), or some other complication of gallstones within 10 to 20 years. If you have surgery, schedule it at a time that is convenient for you and at a time when you are not sick. °HOME CARE INSTRUCTIONS  °· Drink plenty of clear fluids. °· Avoid fatty, greasy or fried foods, or any foods that make your pain worse. °· Take medications as directed. °SEEK MEDICAL  CARE IF:  °· You develop a fever over 100.5° F (38.1° C). °· Your pain gets worse over time. °· You develop nausea that prevents you from eating and drinking. °· You develop vomiting. °SEEK IMMEDIATE MEDICAL CARE IF:  °· You have continuous or severe belly (abdominal) pain which is not relieved with medications. °· You develop nausea and vomiting which is not relieved with medications. °· You have symptoms of biliary colic and you suddenly develop a fever and shaking chills. This may signal cholecystitis. Call your caregiver immediately. °· You develop a yellow color to your skin or the white part of your eyes (jaundice). °Document Released: 12/02/2005 Document Revised: 09/23/2011 Document Reviewed: 02/11/2008 °Exit[employer]® Patient Information ©2015 [employer]. This information is not intended to replace advice given to you by your health care provider. Make sure you discuss any questions you have with your health care provider. ° °

## 2014-12-15 NOTE — ED Notes (Signed)
Pt c/o rt abd pain and has hx of gallstones.

## 2014-12-15 NOTE — ED Provider Notes (Signed)
CSN: [id number]     Arrival date & time 12/15/14  0631 History   First MD Initiated Contact with Patient 12/15/14 0700     Chief Complaint  Patient presents with  . Abdominal Pain     (Consider location/radiation/quality/duration/timing/severity/associated sxs/prior Treatment) Patient is a 24 y.o. female presenting with abdominal pain. The history is provided by the patient.  Abdominal Pain Associated symptoms: diarrhea and nausea   Associated symptoms: no chest pain, no shortness of breath and no vomiting    patient with abdominal pain. Began around 3 in the morning today. Described as cramp initially. His continued. She has had a little bit of diarrhea. Some nausea. No fevers. She states she has known gallstones. She states couple years ago she had similar pain and had a CAT scan done and showed gallstones. She has had no pain episodes like this since. No fevers. She denies possibility of pregnancy. She is not hungry. Pain is dull and constant and somewhat crampy. Worse with movement. The pain has been the upper abdomen all time  History reviewed. No pertinent past medical history. Past Surgical History  Procedure Laterality Date  . Hernia repair     History reviewed. No pertinent family history. History  Substance Use Topics  . Smoking status: Never Smoker   . Smokeless tobacco: Not on file  . Alcohol Use: Yes   OB History    No data available     Review of Systems  Constitutional: Positive for appetite change. Negative for activity change.  Eyes: Negative for pain.  Respiratory: Negative for chest tightness and shortness of breath.   Cardiovascular: Negative for chest pain and leg swelling.  Gastrointestinal: Positive for nausea, abdominal pain and diarrhea. Negative for vomiting.  Genitourinary: Negative for flank pain.  Musculoskeletal: Negative for back pain and neck stiffness.  Skin: Negative for rash.  Neurological: Negative for weakness, numbness and headaches.   Psychiatric/Behavioral: Negative for behavioral problems.      Allergies  Iohexol  Home Medications   Prior to Admission medications   Medication Sig Start Date End Date Taking? Authorizing Provider  ciprofloxacin (CIPRO) 500 MG tablet Take 1 tablet (500 mg total) by mouth 2 (two) times daily. Patient not taking: Reported on 12/15/2014 12/10/14   Mechele ClaudeWarren Stacks, MD   BP 109/67 mmHg  Pulse 76  Temp(Src) 98.1 F (36.7 C) (Oral)  Resp 16  Ht 5\' 7"  (1.702 m)  Wt 220 lb (99.791 kg)  BMI 34.45 kg/m2  SpO2 98%  LMP 11/20/2014 Physical Exam  Constitutional: She appears well-developed.  Patient is obese and appears uncomfortable  Cardiovascular: Normal rate and regular rhythm.   Pulmonary/Chest: Effort normal.  Abdominal: There is tenderness.  Right upper quadrant tenderness without rebound or guarding. No masses.  Genitourinary:  No CVA tenderness  Musculoskeletal: Normal range of motion.  Neurological: She is alert.  Skin: Skin is warm.    ED Course  Procedures (including critical care time) Labs Review Labs Reviewed  COMPREHENSIVE METABOLIC PANEL - Abnormal; Notable for the following:    Glucose, Bld 102 (*)    All other components within normal limits  URINALYSIS, ROUTINE W REFLEX MICROSCOPIC (NOT AT [facility]) - Abnormal; Notable for the following:    Specific Gravity, Urine >1.030 (*)    All other components within normal limits  CBC WITH DIFFERENTIAL/PLATELET  LIPASE, BLOOD  PREGNANCY, URINE    Imaging Review [place] Abdomen Limited  12/15/2014   CLINICAL DATA:  24 year old female with a history of abdominal  pain with nausea.  EXAM: US ABDOMEN LIMITED - RIGHT UPPER QUADRANT  COMPARISON:  CT 04/12/2013  FINDINGS: Gallbladder:  Multiple internal reflectors of the gallbladder with posterior shadowing. No gallbladder wall thickening. No pericholecystic fluid. Negative sonographic Murphy sign, although there is a note made that the patient has received pain medications. Stones are  present throughout the lumen of the gallbladder including the gallbladder neck.  Common bile duct:  Diameter: 2.2 mm  Liver:  No focal lesion identified. Within normal limits in parenchymal echogenicity.  IMPRESSION: Cholelithiasis without sonographic evidence of acute cholecystitis. Sonographic Murphy's sign is negative, though findings may be considered equivocal given that the patient received pain medications. If there is persisting concern for acute cholecystitis correlation with nuclear medicine HIDA study may be considered.  Signed,  Yvone Neu. Loreta Ave, DO  [facility]  [facility]   Electronically Signed   By: Gilmer Mor D.O.   On: 12/15/2014 08:43     EKG Interpretation None      MDM   Final diagnoses:  Biliary colic    Patient with abdominal pain. Has apparent biliary colic. Mild tenderness but lab work overall reassuring area does have stone at the neck of the gallbladder. Discussed with Dr. Lovell Sheehan, who will see the patient follow-up.    Benjiman Core, MD 12/16/14 [phone]

## 2014-12-20 NOTE — Patient Instructions (Signed)
Marisa Suarez  12/20/2014     Your procedure is scheduled on 12/23/14.  [facility] at 07:30  [facility].M.  Call this number if you have problems the morning of surgery:  [phone]   Remember:  Do not eat food or drink liquids after midnight.  Take these medicines the morning of surgery with A SIP OF WATER None   Do not wear jewelry, make-up or nail polish.  Do not wear lotions, powders, or perfumes.  You may wear deodorant.  Do not shave 48 hours prior to surgery.  Men may shave face and neck.  Do not bring valuables to the hospital.  [facility] is not responsible for any belongings or valuables.  Contacts, dentures or bridgework may not be worn into surgery.  Leave your suitcase in the car.  After surgery it may be brought to your room.  For patients admitted to the hospital, discharge time will be determined by your treatment team.  Patients discharged the day of surgery will not be allowed to drive home.   Special instructions:  Shower with Hibiclens (CHG bath) the night before surgery and the morning of surgery.  Please read over the following fact sheets that you were given. Anesthesia Post-op Instructions    Laparoscopic Cholecystectomy Laparoscopic cholecystectomy is surgery to remove the gallbladder. The gallbladder is located in the upper right part of the abdomen, behind the liver. It is a storage sac for bile produced in the liver. Bile aids in the digestion and absorption of fats. Cholecystectomy is often done for inflammation of the gallbladder (cholecystitis). This condition is usually caused by a buildup of gallstones (cholelithiasis) in your gallbladder. Gallstones can block the flow of bile, resulting in inflammation and pain. In severe cases, emergency surgery may be required. When emergency surgery is not required, you will have time to prepare for the procedure. Laparoscopic surgery is an alternative to open surgery. Laparoscopic surgery has a shorter  recovery time. Your common bile duct may also need to be examined during the procedure. If stones are found in the common bile duct, they may be removed. LET [facility] KNOW ABOUT:  Any allergies you have.  All medicines you are taking, including vitamins, herbs, eye drops, creams, and over-the-counter medicines.  Previous problems you or members of your family have had with the use of anesthetics.  Any blood disorders you have.  Previous surgeries you have had.  Medical conditions you have. RISKS AND COMPLICATIONS Generally, this is a safe procedure. However, as with any procedure, complications can occur. Possible complications include:  Infection.  Damage to the common bile duct, nerves, arteries, veins, or other internal organs such as the stomach, liver, or intestines.  Bleeding.  A stone may remain in the common bile duct.  A bile leak from the cyst duct that is clipped when your gallbladder is removed.  The need to convert to open surgery, which requires a larger incision in the abdomen. This may be necessary if your surgeon thinks it is not safe to continue with a laparoscopic procedure. BEFORE THE PROCEDURE  Ask your health care provider about changing or stopping any regular medicines. You will need to stop taking aspirin or blood thinners at least 5 days prior to surgery.  Do not eat or drink anything after midnight the night before surgery.  Let your health care provider know if you develop a cold or other infectious problem before surgery. PROCEDURE   You will be  given medicine to make you sleep through the procedure (general anesthetic). A breathing tube will be placed in your mouth.  When you are asleep, your surgeon will make several small cuts (incisions) in your abdomen.  A thin, lighted tube with a tiny camera on the end (laparoscope) is inserted through one of the small incisions. The camera on the laparoscope sends a picture to a TV screen  in the operating room. This gives the surgeon a good view inside your abdomen.  A gas will be pumped into your abdomen. This expands your abdomen so that the surgeon has more room to perform the surgery.  Other tools needed for the procedure are inserted through the other incisions. The gallbladder is removed through one of the incisions.  After the removal of your gallbladder, the incisions will be closed with stitches, staples, or skin glue. AFTER THE PROCEDURE  You will be taken to a recovery area where your progress will be checked often.  You may be allowed to go home the same day if your pain is controlled and you can tolerate liquids. Document Released: 07/01/2005 Document Revised: 04/21/2013 Document Reviewed: 02/10/2013 [facility] Patient Information 2015 [place], [place]. This information is not intended to replace advice given to you by your health care provider. Make sure you discuss any questions you have with your health care provider.    PATIENT INSTRUCTIONS POST-ANESTHESIA  IMMEDIATELY FOLLOWING SURGERY:  Do not drive or operate machinery for the first twenty four hours after surgery.  Do not make any important decisions for twenty four hours after surgery or while taking narcotic pain medications or sedatives.  If you develop intractable nausea and vomiting or a severe headache please notify your doctor immediately.  FOLLOW-UP:  Please make an appointment with your surgeon as instructed. You do not need to follow up with anesthesia unless specifically instructed to do so.  WOUND CARE INSTRUCTIONS (if applicable):  Keep a dry clean dressing on the anesthesia/puncture wound site if there is drainage.  Once the wound has quit draining you may leave it open to air.  Generally you should leave the bandage intact for twenty four hours unless there is drainage.  If the epidural site drains for more than 36-48 hours please call the anesthesia department.  QUESTIONS?:  Please feel free  to call your physician or the hospital operator if you have any questions, and they will be happy to assist you.

## 2014-12-21 ENCOUNTER — Encounter (HOSPITAL_COMMUNITY): Payer: Self-pay

## 2014-12-21 ENCOUNTER — Encounter (HOSPITAL_COMMUNITY)
Admission: RE | Admit: 2014-12-21 | Discharge: 2014-12-21 | Disposition: A | Discharge status: Home / Self Care | Payer: BLUE CROSS/BLUE SHIELD | Source: Ambulatory Visit | Attending: General Surgery | Admitting: General Surgery

## 2014-12-21 HISTORY — DX: Anemia, unspecified: D64.9

## 2014-12-21 NOTE — H&P (Signed)
  NTS SOAP Note  Vital Signs:  Vitals as of: 12/20/2014: Systolic 134: Diastolic 77: Heart Rate 75: Temp 98.56F: Height 575ft 7in: Weight 233Lbs 0 Ounces: Pain Level 3: BMI 36.49  BMI : 36.49 kg/m2  Subjective: This 24 year old female presents for of gallstones.  Has had multiple episodes of right upper quadrant abdominal pain, nausea, and fatty food intolerance over the past few years.  Recently seen in the ER.  U/S of gallbladder reveals cholelithiasis, normal common bile duct.  No fever, chills, jaundice.  Review of Symptoms:  Constitutional:unremarkable   Head:unremarkable Eyes:unremarkable   Nose/Mouth/Throat:unremarkable Cardiovascular:  unremarkable Respiratory:unremarkable Gastrointestinal:    abdominal pain, nausea Genitourinary:unremarkable   Musculoskeletal:unremarkable Skin:unremarkable Hematolgic/Lymphatic:unremarkable   Allergic/Immunologic:unremarkable   Past Medical History:  Reviewed  Past Medical History  Surgical History: umbilical herniorrhaphy as an infant Medical Problems: none Allergies: shellfish, iohexol Medications: none   Social History:Reviewed  Social History  Preferred Language: English Race:  Black or African American Ethnicity: Not Hispanic / Latino Age: 24 year Marital Status:  S Alcohol: socially   Smoking Status: Never smoker reviewed on 12/20/2014 Functional Status reviewed on 12/20/2014 ------------------------------------------------ Bathing: Normal Cooking: Normal Dressing: Normal Driving: Normal Eating: Normal Managing Meds: Normal Oral Care: Normal Shopping: Normal Toileting: Normal Transferring: Normal Walking: Normal Cognitive Status reviewed on 12/20/2014 ------------------------------------------------ Attention: Normal Decision Making: Normal Language: Normal Memory: Normal Motor: Normal Perception: Normal Problem Solving: Normal Visual and Spatial: Normal   Family  History:Reviewed  Family Health History Family History is Unknown    Objective Information: General:Well appearing, well nourished in no distress. no scleral icterus Heart:RRR, no murmur or gallop.  Normal S1, S2.  No S3, S4.  Lungs:  CTA bilaterally, no wheezes, rhonchi, rales.  Breathing unlabored. Abdomen:Soft, mildly tender int the right upper quadrant to palpation, ND, no HSM, no masses. LFT's negative Assessment:Cholelithiasis, biliary colic  Diagnoses: 574.20  K80.20 Gallstone (Calculus of gallbladder without cholecystitis without obstruction)  Procedures: [id number] - OFFICE OUTPATIENT NEW 30 MINUTES    Plan:  Scheduled for laparoscopic cholecystectomy on 12/23/14.   Patient Education:Alternative treatments to surgery were discussed with patient (and family).  Risks and benefits  of procedure including bleeding, infection, hepatobiliary injury, and the possibility of an open procedure were fully explained to the patient (and family) who gave informed consent. Patient/family questions were addressed.  Follow-up:Pending Surgery

## 2014-12-23 ENCOUNTER — Encounter (HOSPITAL_COMMUNITY): Payer: Self-pay | Admitting: *Deleted

## 2014-12-23 ENCOUNTER — Ambulatory Visit (HOSPITAL_COMMUNITY)
Admission: RE | Admit: 2014-12-23 | Discharge: 2014-12-23 | Disposition: A | Discharge status: Home / Self Care | Payer: BLUE CROSS/BLUE SHIELD | Source: Ambulatory Visit | Attending: General Surgery | Admitting: General Surgery

## 2014-12-23 ENCOUNTER — Ambulatory Visit (HOSPITAL_COMMUNITY): Payer: BLUE CROSS/BLUE SHIELD | Admitting: Anesthesiology

## 2014-12-23 ENCOUNTER — Encounter (HOSPITAL_COMMUNITY)
Admission: RE | Disposition: A | Discharge status: Home / Self Care | Payer: Self-pay | Source: Ambulatory Visit | Attending: General Surgery

## 2014-12-23 DIAGNOSIS — K8012 Calculus of gallbladder with acute and chronic cholecystitis without obstruction: Secondary | ICD-10-CM | POA: Insufficient documentation

## 2014-12-23 DIAGNOSIS — K8020 Calculus of gallbladder without cholecystitis without obstruction: Secondary | ICD-10-CM | POA: Diagnosis present

## 2014-12-23 DIAGNOSIS — Z6834 Body mass index (BMI) 34.0-34.9, adult: Secondary | ICD-10-CM | POA: Diagnosis not present

## 2014-12-23 HISTORY — PX: CHOLECYSTECTOMY: SHX55

## 2014-12-23 SURGERY — LAPAROSCOPIC CHOLECYSTECTOMY
Anesthesia: General

## 2014-12-23 MED ORDER — ONDANSETRON HCL 4 MG/2ML IJ SOLN
4.0000 mg | Freq: Once | INTRAMUSCULAR | Status: AC
  Administered 2014-12-23: 4 mg via INTRAVENOUS

## 2014-12-23 MED ORDER — PROPOFOL 10 MG/ML IV BOLUS
INTRAVENOUS | Status: AC
  Filled 2014-12-23: qty 20

## 2014-12-23 MED ORDER — LACTATED RINGERS IV SOLN
INTRAVENOUS | Status: DC
  Administered 2014-12-23 (×2): via INTRAVENOUS

## 2014-12-23 MED ORDER — FENTANYL CITRATE (PF) 100 MCG/2ML IJ SOLN
INTRAMUSCULAR | Status: AC
  Filled 2014-12-23: qty 2

## 2014-12-23 MED ORDER — ROCURONIUM BROMIDE 100 MG/10ML IV SOLN
INTRAVENOUS | Status: DC | PRN
  Administered 2014-12-23: 40 mg via INTRAVENOUS

## 2014-12-23 MED ORDER — OXYCODONE-ACETAMINOPHEN 7.5-325 MG PO TABS: 1.0000 | ORAL_TABLET | ORAL | Status: DC | PRN

## 2014-12-23 MED ORDER — POVIDONE-IODINE 10 % EX OINT
TOPICAL_OINTMENT | CUTANEOUS | Status: AC
  Filled 2014-12-23: qty 1

## 2014-12-23 MED ORDER — GLYCOPYRROLATE 0.2 MG/ML IJ SOLN
INTRAMUSCULAR | Status: DC | PRN
  Administered 2014-12-23: 0.4 mg via INTRAVENOUS

## 2014-12-23 MED ORDER — KETOROLAC TROMETHAMINE 30 MG/ML IJ SOLN
INTRAMUSCULAR | Status: AC
  Filled 2014-12-23: qty 1

## 2014-12-23 MED ORDER — MIDAZOLAM HCL 5 MG/5ML IJ SOLN
INTRAMUSCULAR | Status: DC | PRN
  Administered 2014-12-23: 2 mg via INTRAVENOUS

## 2014-12-23 MED ORDER — KETOROLAC TROMETHAMINE 30 MG/ML IJ SOLN
30.0000 mg | Freq: Once | INTRAMUSCULAR | Status: AC
  Administered 2014-12-23: 30 mg via INTRAVENOUS

## 2014-12-23 MED ORDER — GLYCOPYRROLATE 0.2 MG/ML IJ SOLN
INTRAMUSCULAR | Status: AC
  Filled 2014-12-23: qty 2

## 2014-12-23 MED ORDER — FENTANYL CITRATE (PF) 100 MCG/2ML IJ SOLN
25.0000 ug | INTRAMUSCULAR | Status: DC | PRN
  Administered 2014-12-23: 50 ug via INTRAVENOUS
  Administered 2014-12-23 (×2): 25 ug via INTRAVENOUS
  Administered 2014-12-23 (×2): 50 ug via INTRAVENOUS

## 2014-12-23 MED ORDER — CIPROFLOXACIN IN D5W 400 MG/200ML IV SOLN
400.0000 mg | INTRAVENOUS | Status: AC
  Administered 2014-12-23: 400 mg via INTRAVENOUS
  Filled 2014-12-23: qty 200

## 2014-12-23 MED ORDER — FENTANYL CITRATE (PF) 250 MCG/5ML IJ SOLN
INTRAMUSCULAR | Status: AC
  Filled 2014-12-23: qty 5

## 2014-12-23 MED ORDER — MIDAZOLAM HCL 2 MG/2ML IJ SOLN
INTRAMUSCULAR | Status: AC
  Filled 2014-12-23: qty 2

## 2014-12-23 MED ORDER — ONDANSETRON HCL 4 MG/2ML IJ SOLN
4.0000 mg | Freq: Once | INTRAMUSCULAR | Status: AC | PRN
  Administered 2014-12-23: 4 mg via INTRAVENOUS
  Filled 2014-12-23: qty 2

## 2014-12-23 MED ORDER — POVIDONE-IODINE 10 % OINT PACKET
TOPICAL_OINTMENT | CUTANEOUS | Status: DC | PRN
  Administered 2014-12-23: 1 via TOPICAL

## 2014-12-23 MED ORDER — LIDOCAINE HCL 1 % IJ SOLN
INTRAMUSCULAR | Status: DC | PRN
  Administered 2014-12-23: 35 mg via INTRADERMAL

## 2014-12-23 MED ORDER — HEMOSTATIC AGENTS (NO CHARGE) OPTIME
TOPICAL | Status: DC | PRN
  Administered 2014-12-23: 2 via TOPICAL

## 2014-12-23 MED ORDER — CHLORHEXIDINE GLUCONATE 4 % EX LIQD: 1.0000 "application " | Freq: Once | CUTANEOUS | Status: DC

## 2014-12-23 MED ORDER — ONDANSETRON HCL 4 MG/2ML IJ SOLN
INTRAMUSCULAR | Status: AC
  Filled 2014-12-23: qty 2

## 2014-12-23 MED ORDER — BUPIVACAINE HCL (PF) 0.5 % IJ SOLN
INTRAMUSCULAR | Status: DC | PRN
  Administered 2014-12-23: 10 mL

## 2014-12-23 MED ORDER — PROPOFOL 10 MG/ML IV BOLUS
INTRAVENOUS | Status: DC | PRN
  Administered 2014-12-23: 150 mg via INTRAVENOUS

## 2014-12-23 MED ORDER — FENTANYL CITRATE (PF) 100 MCG/2ML IJ SOLN
INTRAMUSCULAR | Status: AC
Start: 2014-12-23 — End: 2014-12-23
  Filled 2014-12-23: qty 2

## 2014-12-23 MED ORDER — BUPIVACAINE HCL (PF) 0.5 % IJ SOLN
INTRAMUSCULAR | Status: AC
  Filled 2014-12-23: qty 30

## 2014-12-23 MED ORDER — FENTANYL CITRATE (PF) 100 MCG/2ML IJ SOLN
INTRAMUSCULAR | Status: DC | PRN
Start: 2014-12-23 — End: 2014-12-23
  Administered 2014-12-23: 100 ug via INTRAVENOUS
  Administered 2014-12-23 (×3): 50 ug via INTRAVENOUS

## 2014-12-23 MED ORDER — MIDAZOLAM HCL 2 MG/2ML IJ SOLN
1.0000 mg | INTRAMUSCULAR | Status: DC | PRN
  Administered 2014-12-23 (×2): 2 mg via INTRAVENOUS
  Filled 2014-12-23: qty 2

## 2014-12-23 MED ORDER — SODIUM CHLORIDE 0.9 % IR SOLN
Status: DC | PRN
  Administered 2014-12-23: 1000 mL

## 2014-12-23 MED ORDER — NEOSTIGMINE METHYLSULFATE 10 MG/10ML IV SOLN
INTRAVENOUS | Status: DC | PRN
  Administered 2014-12-23: 2 mg via INTRAVENOUS
  Administered 2014-12-23: 1 mg via INTRAVENOUS

## 2014-12-23 SURGICAL SUPPLY — 40 items
APPLIER CLIP LAPSCP 10X32 DD (CLIP) ×2 IMPLANT
BAG HAMPER (MISCELLANEOUS) ×2 IMPLANT
BAG SPEC RTRVL LRG 6X4 10 (ENDOMECHANICALS) ×1
CHLORAPREP W/TINT 26ML (MISCELLANEOUS) ×2 IMPLANT
CLOTH BEACON ORANGE TIMEOUT ST (SAFETY) ×2 IMPLANT
COVER LIGHT HANDLE STERIS (MISCELLANEOUS) ×4 IMPLANT
DECANTER SPIKE VIAL GLASS SM (MISCELLANEOUS) ×2 IMPLANT
ELECT REM PT RETURN 9FT ADLT (ELECTROSURGICAL) ×2
ELECTRODE REM PT RTRN 9FT ADLT (ELECTROSURGICAL) ×1 IMPLANT
FILTER SMOKE EVAC LAPAROSHD (FILTER) ×2 IMPLANT
FORMALIN 10 PREFIL 120ML (MISCELLANEOUS) ×2 IMPLANT
GLOVE BIOGEL PI IND STRL 7.0 (GLOVE) IMPLANT
GLOVE BIOGEL PI INDICATOR 7.0 (GLOVE) ×3
GLOVE ECLIPSE 6.5 STRL STRAW (GLOVE) ×1 IMPLANT
GLOVE SURG SS PI 7.5 STRL IVOR (GLOVE) ×3 IMPLANT
GOWN STRL REUS W/ TWL XL LVL3 (GOWN DISPOSABLE) ×1 IMPLANT
GOWN STRL REUS W/TWL LRG LVL3 (GOWN DISPOSABLE) ×4 IMPLANT
GOWN STRL REUS W/TWL XL LVL3 (GOWN DISPOSABLE) ×2
HEMOSTAT SNOW SURGICEL 2X4 (HEMOSTASIS) ×3 IMPLANT
INST SET LAPROSCOPIC AP (KITS) ×2 IMPLANT
KIT ROOM TURNOVER APOR (KITS) ×2 IMPLANT
MANIFOLD NEPTUNE II (INSTRUMENTS) ×2 IMPLANT
NDL INSUFFLATION 14GA 120MM (NEEDLE) ×1 IMPLANT
NEEDLE INSUFFLATION 14GA 120MM (NEEDLE) ×2 IMPLANT
NS IRRIG 1000ML POUR BTL (IV SOLUTION) ×2 IMPLANT
PACK LAP CHOLE LZT030E (CUSTOM PROCEDURE TRAY) ×2 IMPLANT
PAD ARMBOARD 7.5X6 YLW CONV (MISCELLANEOUS) ×2 IMPLANT
POUCH SPECIMEN RETRIEVAL 10MM (ENDOMECHANICALS) ×2 IMPLANT
SET BASIN LINEN APH (SET/KITS/TRAYS/PACK) ×2 IMPLANT
SLEEVE ENDOPATH XCEL 5M (ENDOMECHANICALS) ×2 IMPLANT
SPONGE GAUZE 2X2 8PLY STRL LF (GAUZE/BANDAGES/DRESSINGS) ×8 IMPLANT
STAPLER VISISTAT (STAPLE) ×2 IMPLANT
SUT VICRYL 0 UR6 27IN ABS (SUTURE) ×2 IMPLANT
TAPE CLOTH SURG 4X10 WHT LF (GAUZE/BANDAGES/DRESSINGS) ×1 IMPLANT
TROCAR ENDO BLADELESS 11MM (ENDOMECHANICALS) ×2 IMPLANT
TROCAR XCEL NON-BLD 5MMX100MML (ENDOMECHANICALS) ×2 IMPLANT
TROCAR XCEL UNIV SLVE 11M 100M (ENDOMECHANICALS) ×2 IMPLANT
TUBING INSUFFLATION (TUBING) ×2 IMPLANT
WARMER LAPAROSCOPE (MISCELLANEOUS) ×2 IMPLANT
YANKAUER SUCT 12FT TUBE ARGYLE (SUCTIONS) ×2 IMPLANT

## 2014-12-23 NOTE — Discharge Instructions (Signed)

## 2014-12-23 NOTE — Interval H&P Note (Signed)
History and Physical Interval Note:  12/23/2014 9:28 AM  Marisa Suarez  has presented today for surgery, with the diagnosis of cholelithiasis  The various methods of treatment have been discussed with the patient and family. After consideration of risks, benefits and other options for treatment, the patient has consented to  Procedure(s): LAPAROSCOPIC CHOLECYSTECTOMY (N/A) as a surgical intervention .  The patient's history has been reviewed, patient examined, no change in status, stable for surgery.  I have reviewed the patient's chart and labs.  Questions were answered to the patient's satisfaction.     Franky Macho A

## 2014-12-23 NOTE — Transfer of Care (Signed)
Immediate Anesthesia Transfer of Care Note  Patient: Marisa Suarez  Procedure(s) Performed: Procedure(s): LAPAROSCOPIC CHOLECYSTECTOMY (N/A)  Patient Location: PACU  Anesthesia Type:General  Level of Consciousness: awake, alert  and patient cooperative  Airway & Oxygen Therapy: Patient Spontanous Breathing and Patient connected to face mask oxygen  Post-op Assessment: Report given to RN, Post -op Vital signs reviewed and stable and Patient moving all extremities  Post vital signs: Reviewed and stable  Last Vitals:  Filed Vitals:   12/23/14 0950  BP: 117/77  Pulse:   Temp:   Resp: 25    Complications: No apparent anesthesia complications

## 2014-12-23 NOTE — Op Note (Signed)
Patient:  Marisa Suarez  DOB:  Nov 22, 1990  MRN:  [id number]   Preop Diagnosis:  Cholecystitis, cholelithiasis  Postop Diagnosis:  Same  Procedure:  Laparoscopic cholecystectomy  Surgeon:  Franky Macho, M.D.  Anes:  Gen. endotracheal  Indications:  Patient is a 24 year old black female who presents with cholecystitis secondary to cholelithiasis. The risks and benefits of the procedure including bleeding, infection, hepatobiliary injury, and the possibility of an open procedure were fully explained to the patient, who gave informed consent.  Procedure note:  The patient is placed the supine position. After induction of general endotracheal anesthesia, the abdomen was prepped and draped using usual sterile technique with DuraPrep. Surgical site confirmation was performed.  The supraumbilical incision was made down to the fascia. A Veress needle was introduced into the abdominal cavity and confirmation of placement was done using the saline drop test. The abdomen was then insufflated to 16 mmHg pressure. An 11 mm trocar was introduced into the abdominal cavity under direct visualization without difficulty. The patient was placed in reverse Trendelenburg position and additional 11 mm trocar was placed the epigastric region and 5 mm trochars were placed in the right upper quadrant and right flank regions. The liver was inspected and noted to be within normal limits. The gallbladder was retracted in a dynamic fashion in order to expose the triangle of Calot. The cystic duct was first identified. Its junction to the infundibulum was fully identified. Endoclips were placed proximally and distally on the cystic duct, and the cystic duct was divided. This was likewise done cystic artery. The gallbladder was freed away from the gallbladder fossa using electrocautery. The gallbladder was delivered trocar site using an Endo Catch bag. The gallbladder fossa was inspected no abnormal bleeding or bile leakage was  noted. Surgicel is placed the gallbladder fossa. All fluid and air were then evacuated from the abdominal cavity prior to removal of the trochars.  All wounds were irrigated with normal saline. All wounds were injected with 0.5% Sensorcaine. The epigastric fascia as well as supraumbilical fascia were reapproximated using 0 Vicryl interrupted sutures. All skin incisions were closed using staples. Betadine ointment and dry sterile dressings were applied.  All tape and needle counts were correct at the end of the procedure. The patient was extubated in the operating room and transferred to PACU in stable condition.  Complications:  None  EBL:  Minimal  Specimen:  Gallbladder

## 2014-12-23 NOTE — Anesthesia Postprocedure Evaluation (Signed)
  Anesthesia Post-op Note  Patient: Marisa Suarez  Procedure(s) Performed: Procedure(s): LAPAROSCOPIC CHOLECYSTECTOMY (N/A)  Patient Location: PACU  Anesthesia Type:General  Level of Consciousness: awake, alert , oriented and patient cooperative  Airway and Oxygen Therapy: Patient Spontanous Breathing  Post-op Pain: 3 /10, mild  Post-op Assessment: Post-op Vital signs reviewed, Patient's Cardiovascular Status Stable, Respiratory Function Stable and Patent Airway              Post-op Vital Signs: Reviewed and stable  Last Vitals:  Filed Vitals:   12/23/14 1100  BP: 124/60  Pulse:   Temp: 37.4 C  Resp:     Complications: No apparent anesthesia complications

## 2014-12-23 NOTE — Anesthesia Procedure Notes (Signed)
Procedure Name: Intubation Date/Time: 12/23/2014 10:08 AM Performed by: Despina Hidden Pre-anesthesia Checklist: Patient being monitored, Suction available, Emergency Drugs available and Patient identified Patient Re-evaluated:Patient Re-evaluated prior to inductionOxygen Delivery Method: Circle system utilized Preoxygenation: Pre-oxygenation with 100% oxygen Intubation Type: IV induction Ventilation: Mask ventilation without difficulty and Oral airway inserted - appropriate to patient size Laryngoscope Size: Mac and 3 Grade View: Grade I Tube size: 7.0 mm Number of attempts: 1 Airway Equipment and Method: Stylet and Oral airway Placement Confirmation: ETT inserted through vocal cords under direct vision,  positive ETCO2 and breath sounds checked- equal and bilateral Secured at: 22 cm Tube secured with: Tape Dental Injury: Teeth and Oropharynx as per pre-operative assessment

## 2014-12-23 NOTE — Anesthesia Preprocedure Evaluation (Signed)
Anesthesia Evaluation  Patient identified by MRN, date of birth, ID band Patient awake    Reviewed: Allergy & Precautions, NPO status , Patient's Chart, lab work & pertinent test results  Airway Mallampati: I  TM Distance: >3 FB     Dental  (+) Teeth Intact, Dental Advisory Given   Pulmonary neg pulmonary ROS,  breath sounds clear to auscultation        Cardiovascular negative cardio ROS  Rhythm:Regular Rate:Normal     Neuro/Psych    GI/Hepatic negative GI ROS,   Endo/Other  Morbid obesity  Renal/GU      Musculoskeletal   Abdominal   Peds  Hematology  (+) anemia ,   Anesthesia Other Findings   Reproductive/Obstetrics                             Anesthesia Physical Anesthesia Plan  ASA: II  Anesthesia Plan: General   Post-op Pain Management:    Induction: Intravenous  Airway Management Planned: Oral ETT  Additional Equipment:   Intra-op Plan:   Post-operative Plan: Extubation in OR  Informed Consent: I have reviewed the patients History and Physical, chart, labs and discussed the procedure including the risks, benefits and alternatives for the proposed anesthesia with the patient or authorized representative who has indicated his/her understanding and acceptance.     Plan Discussed with:   Anesthesia Plan Comments:         Anesthesia Quick Evaluation

## 2014-12-26 ENCOUNTER — Encounter (HOSPITAL_COMMUNITY): Payer: Self-pay | Admitting: General Surgery

## 2016-08-12 ENCOUNTER — Ambulatory Visit (INDEPENDENT_AMBULATORY_CARE_PROVIDER_SITE_OTHER): Payer: 59 | Admitting: Physician Assistant

## 2016-08-12 ENCOUNTER — Encounter: Payer: Self-pay | Admitting: Physician Assistant

## 2016-08-12 VITALS — BP 115/72 | HR 76 | Temp 97.7°F | Ht 67.0 in | Wt 242.0 lb

## 2016-08-12 DIAGNOSIS — Z30015 Encounter for initial prescription of vaginal ring hormonal contraceptive: Secondary | ICD-10-CM | POA: Insufficient documentation

## 2016-08-12 MED ORDER — ETONOGESTREL-ETHINYL ESTRADIOL 0.12-0.015 MG/24HR VA RING
VAGINAL_RING | VAGINAL | 3 refills | Status: DC
Start: 2016-08-12 — End: 2016-10-22

## 2016-08-12 NOTE — Patient Instructions (Signed)
Estradiol vaginal ring (Estring) What is this medicine? ESTRADIOL (es tra DYE ole) vaginal ring is an insert that contains a female hormone. This medicine helps relieve symptoms of vaginal irritation and dryness that occurs in some women during menopause. This medicine may be used for other purposes; ask your health care provider or pharmacist if you have questions. COMMON BRAND NAME(S): Estring What should I tell my health care provider before I take this medicine? They need to know if you have any of these conditions: -abnormal vaginal bleeding -blood vessel disease or blood clots -breast, cervical, endometrial, ovarian, liver, or uterine cancer -dementia -diabetes -gallbladder disease -heart disease or recent heart attack -high blood pressure -high cholesterol -high level of calcium in the blood -hysterectomy -kidney disease -liver disease -migraine headaches -protein C deficiency -protein S deficiency -stroke -systemic lupus erythematosus (SLE) -tobacco smoker -an unusual or allergic reaction to estrogens, other hormones, medicines, foods, dyes, or preservatives -pregnant or trying to get pregnant -breast-feeding How should I use this medicine? This medicine may be inserted by you or your physician. Follow the directions that are included with your prescription. If you are unsure how to insert the ring, contact your doctor or health care professional. The vaginal ring should remain in place for 90 days. After 90 days you should replace your old ring and insert a new one. Do not stop using except on the advice of your doctor or health care professional. Contact your pediatrician regarding the use of this medicine in children. Special care may be needed. A patient package insert for the product will be given with each prescription and refill. Read this sheet carefully each time. The sheet may change frequently. Overdosage: If you think you have taken too much of this medicine  contact a poison control center or emergency room at once. NOTE: This medicine is only for you. Do not share this medicine with others. What if I miss a dose? If you miss a dose, use it as soon as you can. If it is almost time for your next dose, use only that dose. Do not use double or extra doses. What may interact with this medicine? Do not take this medicine with any of the following medications: -aromatase inhibitors like aminoglutethimide, anastrozole, exemestane, letrozole, testolactone, vorozole This medicine may also interact with the following medications: -carbamazepine -certain antibiotics used to treat infections -certain barbiturates used for inducing sleep or treating seizures -grapefruit juice -medicines for fungus infections like itraconazole and ketoconazole -raloxifene or tamoxifen -rifabutin, rifampin, or rifapentine -ritonavir -St. John's Wort This list may not describe all possible interactions. Give your health care provider a list of all the medicines, herbs, non-prescription drugs, or dietary supplements you use. Also tell them if you smoke, drink alcohol, or use illegal drugs. Some items may interact with your medicine. What should I watch for while using this medicine? Visit your doctor or health care professional for regular checks on your progress. You will need a regular breast and pelvic exam and Pap smear while on this medicine. You should also discuss the need for regular mammograms with your health care professional, and follow his or her guidelines for these tests. This medicine can make your body retain fluid, making your fingers, hands, or ankles swell. Your blood pressure can go up. Contact your doctor or health care professional if you feel you are retaining fluid. If you have any reason to think you are pregnant, stop taking this medicine right away and contact your doctor or health  care professional. Smoking increases the risk of getting a blood clot or  having a stroke while you are taking this medicine, especially if you are more than 24 years old. You are strongly advised not to smoke. If you wear contact lenses and notice visual changes, or if the lenses begin to feel uncomfortable, consult your eye doctor or health care professional. This medicine can increase the risk of developing a condition (endometrial hyperplasia) that may lead to cancer of the lining of the uterus. Taking progestins, another hormone drug, with this medicine lowers the risk of developing this condition. Therefore, if your uterus has not been removed (by a hysterectomy), your doctor may prescribe a progestin for you to take together with your estrogen. You should know, however, that taking estrogens with progestins may have additional health risks. You should discuss the use of estrogens and progestins with your health care professional to determine the benefits and risks for you. If you are going to have surgery, you may need to stop taking this medicine. Consult your health care professional for advice before you schedule the surgery. You may bathe or participate in other activities while using this medicine. You do not need to remove the vaginal ring during sexual or other activities unless you are more comfortable doing so. Within the 90-day dosage period, you may remove the vaginal ring, rinse it with clean lukewarm (not hot or boiling) water, and re-insert the ring as needed. What side effects may I notice from receiving this medicine? Side effects that you should report to your doctor or health care professional as soon as possible: -allergic reactions like skin rash, itching or hives, swelling of the face, lips, or tongue -breast tissue changes or discharge -signs and symptoms of a blood clot such as breathing problems; changes in vision; chest pain; severe, sudden headache; pain, swelling, warmth in the leg; trouble speaking; sudden numbness or weakness of the face, arm  or leg -signs and symptoms of infection like fever or chills; vomiting; diarrhea; muscle pain; dizziness; or a red, sunburn-like rash on face and body -signs and symptoms of liver injury like dark yellow or brown urine; general ill feeling or flu-like symptoms; light-colored stools; loss of appetite; nausea; right upper belly pain; unusually weak or tired; yellowing of the eyes or skin -symptoms of bowel blockage like constipation, abdominal swelling, abdominal pain, inability to pass gas or have a bowel movement -symptoms of vaginal infection like itching, irritation or unusual discharge -unusual or increased vaginal bleeding -vaginal pain or soreness, redness, swelling Side effects that usually do not require medical attention (report to your doctor or health care professional if they continue or are bothersome): -breast tenderness -fluid retention -hair loss -headache -nausea -upset stomach -vaginal spotting This list may not describe all possible side effects. Call your doctor for medical advice about side effects. You may report side effects to FDA at 1-800-FDA-1088. Where should I keep my medicine? Keep out of the reach of children. Store at room temperature between 15 and 25 degrees C (59 and 77 degrees F). Throw away any unused medicine after the expiration date. NOTE: This sheet is a summary. It may not cover all possible information. If you have questions about this medicine, talk to your doctor, pharmacist, or health care provider.  2017 [employer] Standard (2014-04-25 13:20:25)

## 2016-08-12 NOTE — Progress Notes (Signed)
BP 115/72   Pulse 76   Temp 97.7 F (36.5 C) (Oral)   Ht 5\' 7"  (1.702 m)   Wt 242 lb (109.8 kg)   LMP 08/06/2016   BMI 37.90 kg/m    Subjective:    Patient ID: Marisa Suarez, female    DOB: 07/04/1991, 26 y.o.   MRN: [id number]  HPI: Marisa Suarez is a 26 y.o. female presenting on 08/12/2016 for Discuss Birth Control  Patient has been reading about all the contraceptive methods. She has ruled out oral birth control pills because of the very difficult schedule. She is not sure that she can take it at approximately the same time every day due to her work schedule requirements. She is interested in a contraceptive ring or an IUD. She is not interested in Depo-Provera or Implanon.  She is concerned about weight gain on the Provera type medications. Her last Pap smear was at age 66. I have told her that we need to get this scheduled in the next couple months. At this time we can start her NuvaRing and recheck her in 2 months and plan the Pap smear at that time. However we can discuss if the liver ring is effective enough and if she does okay with it. If not we'll plan for an IUD. However she has not had a child and so therefore will probably need a referral to the gynecologist for an implantation of that if warranted  Relevant past medical, surgical, family and social history reviewed and updated as indicated. Allergies and medications reviewed and updated.  Past Medical History:  Diagnosis Date  . Anemia     Past Surgical History:  Procedure Laterality Date  . CHOLECYSTECTOMY N/A 12/23/2014   Procedure: LAPAROSCOPIC CHOLECYSTECTOMY;  Surgeon: Franky Macho Md, MD;  Location: [facility];  Service: General;  Laterality: N/A;  . HERNIA REPAIR     umbilical    Review of Systems  Constitutional: Negative.  Negative for activity change, fatigue and fever.  HENT: Negative.   Eyes: Negative.   Respiratory: Negative.  Negative for cough.   Cardiovascular: Negative.  Negative for chest pain.    Gastrointestinal: Negative.  Negative for abdominal pain.  Endocrine: Negative.   Genitourinary: Negative.  Negative for dysuria.  Musculoskeletal: Negative.   Skin: Negative.   Neurological: Negative.     Allergies as of 08/12/2016      Reactions   Iohexol Cough   Patient began coughing after injection of contrast and had difficulty taking in a deep breath      Medication List       Accurate as of 08/12/16  2:05 PM. Always use your most recent med list.          etonogestrel-ethinyl estradiol 0.12-0.015 MG/24HR vaginal ring Commonly known as:  NUVARING Insert vaginally and leave in place for 3 consecutive weeks, then remove for 1 week.          Objective:    BP 115/72   Pulse 76   Temp 97.7 F (36.5 C) (Oral)   Ht 5\' 7"  (1.702 m)   Wt 242 lb (109.8 kg)   LMP 08/06/2016   BMI 37.90 kg/m   Allergies  Allergen Reactions  . Iohexol Cough    Patient began coughing after injection of contrast and had difficulty taking in a deep breath    Physical Exam  Constitutional: She is oriented to person, place, and time. She appears well-developed and well-nourished.  HENT:  Head: Normocephalic and atraumatic.  Eyes: Conjunctivae and EOM are normal. Pupils are equal, round, and reactive to light.  Cardiovascular: Normal rate, regular rhythm, normal heart sounds and intact distal pulses.   Pulmonary/Chest: Effort normal and breath sounds normal.  Abdominal: Soft. Bowel sounds are normal.  Neurological: She is alert and oriented to person, place, and time. She has normal reflexes.  Skin: Skin is warm and dry. No rash noted.  Psychiatric: She has a normal mood and affect. Her behavior is normal. Judgment and thought content normal.        Assessment & Plan:   1. Encounter for initial prescription of vaginal ring hormonal contraceptive - etonogestrel-ethinyl estradiol (NUVARING) 0.12-0.015 MG/24HR vaginal ring; Insert vaginally and leave in place for 3 consecutive weeks,  then remove for 1 week.  Dispense: 1 each; Refill: 3   Continue all other maintenance medications as listed above.  Follow up plan: Return in about 2 months (around 10/10/2016) for female physical.  Educational handout given for nuvaring  Remus LofflerAngel S. Karlita Lichtman PA-C [facility] [address]  [place], [place] [id number] [phone]   08/12/2016, 2:05 PM

## 2016-10-22 ENCOUNTER — Encounter: Payer: Self-pay | Admitting: Physician Assistant

## 2016-10-22 ENCOUNTER — Ambulatory Visit (INDEPENDENT_AMBULATORY_CARE_PROVIDER_SITE_OTHER): Payer: 59 | Admitting: Physician Assistant

## 2016-10-22 VITALS — BP 125/81 | HR 85 | Temp 97.0°F | Ht 67.0 in | Wt 241.0 lb

## 2016-10-22 DIAGNOSIS — Z01419 Encounter for gynecological examination (general) (routine) without abnormal findings: Secondary | ICD-10-CM

## 2016-10-22 DIAGNOSIS — Z0000 Encounter for general adult medical examination without abnormal findings: Secondary | ICD-10-CM | POA: Diagnosis not present

## 2016-10-22 DIAGNOSIS — Z3044 Encounter for surveillance of vaginal ring hormonal contraceptive device: Secondary | ICD-10-CM

## 2016-10-22 MED ORDER — ETONOGESTREL-ETHINYL ESTRADIOL 0.12-0.015 MG/24HR VA RING: VAGINAL_RING | VAGINAL | 12 refills | Status: AC

## 2016-10-22 NOTE — Patient Instructions (Signed)
Health Maintenance, Female Adopting a healthy lifestyle and getting preventive care can go a long way to promote health and wellness. Talk with your health care provider about what schedule of regular examinations is right for you. This is a good chance for you to check in with your provider about disease prevention and staying healthy. In between checkups, there are plenty of things you can do on your own. Experts have done a lot of research about which lifestyle changes and preventive measures are most likely to keep you healthy. Ask your health care provider for more information. Weight and diet Eat a healthy diet  Be sure to include plenty of vegetables, fruits, low-fat dairy products, and lean protein.  Do not eat a lot of foods high in solid fats, added sugars, or salt.  Get regular exercise. This is one of the most important things you can do for your health.  Most adults should exercise for at least 150 minutes each week. The exercise should increase your heart rate and make you sweat (moderate-intensity exercise).  Most adults should also do strengthening exercises at least twice a week. This is in addition to the moderate-intensity exercise. Maintain a healthy weight  Body mass index (BMI) is a measurement that can be used to identify possible weight problems. It estimates body fat based on height and weight. Your health care provider can help determine your BMI and help you achieve or maintain a healthy weight.  For females 76 years of age and older:  A BMI below 18.5 is considered underweight.  A BMI of 18.5 to 24.9 is normal.  A BMI of 25 to 29.9 is considered overweight.  A BMI of 30 and above is considered obese. Watch levels of cholesterol and blood lipids  You should start having your blood tested for lipids and cholesterol at 24 years of age, then have this test every 5 years.  You may need to have your cholesterol levels checked more often if:  Your lipid or  cholesterol levels are high.  You are older than 24 years of age.  You are at high risk for heart disease. Cancer screening Lung Cancer  Lung cancer screening is recommended for adults 64-42 years old who are at high risk for lung cancer because of a history of smoking.  A yearly low-dose CT scan of the lungs is recommended for people who:  Currently smoke.  Have quit within the past 15 years.  Have at least a 30-pack-year history of smoking. A pack year is smoking an average of one pack of cigarettes a day for 1 year.  Yearly screening should continue until it has been 15 years since you quit.  Yearly screening should stop if you develop a health problem that would prevent you from having lung cancer treatment. Breast Cancer  Practice breast self-awareness. This means understanding how your breasts normally appear and feel.  It also means doing regular breast self-exams. Let your health care provider know about any changes, no matter how small.  If you are in your 20s or 30s, you should have a clinical breast exam (CBE) by a health care provider every 1-3 years as part of a regular health exam.  If you are 34 or older, have a CBE every year. Also consider having a breast X-ray (mammogram) every year.  If you have a family history of breast cancer, talk to your health care provider about genetic screening.  If you are at high risk for breast cancer, talk  to your health care provider about having an MRI and a mammogram every year.  Breast cancer gene (BRCA) assessment is recommended for women who have family members with BRCA-related cancers. BRCA-related cancers include:  Breast.  Ovarian.  Tubal.  Peritoneal cancers.  Results of the assessment will determine the need for genetic counseling and BRCA1 and BRCA2 testing. Cervical Cancer  Your health care provider may recommend that you be screened regularly for cancer of the pelvic organs (ovaries, uterus, and vagina).  This screening involves a pelvic examination, including checking for microscopic changes to the surface of your cervix (Pap test). You may be encouraged to have this screening done every 3 years, beginning at age 24.  For women ages 66-65, health care providers may recommend pelvic exams and Pap testing every 3 years, or they may recommend the Pap and pelvic exam, combined with testing for human papilloma virus (HPV), every 5 years. Some types of HPV increase your risk of cervical cancer. Testing for HPV may also be done on women of any age with unclear Pap test results.  Other health care providers may not recommend any screening for nonpregnant women who are considered low risk for pelvic cancer and who do not have symptoms. Ask your health care provider if a screening pelvic exam is right for you.  If you have had past treatment for cervical cancer or a condition that could lead to cancer, you need Pap tests and screening for cancer for at least 20 years after your treatment. If Pap tests have been discontinued, your risk factors (such as having a new sexual partner) need to be reassessed to determine if screening should resume. Some women have medical problems that increase the chance of getting cervical cancer. In these cases, your health care provider may recommend more frequent screening and Pap tests. Colorectal Cancer  This type of cancer can be detected and often prevented.  Routine colorectal cancer screening usually begins at 24 years of age and continues through 24 years of age.  Your health care provider may recommend screening at an earlier age if you have risk factors for colon cancer.  Your health care provider may also recommend using home test kits to check for hidden blood in the stool.  A small camera at the end of a tube can be used to examine your colon directly (sigmoidoscopy or colonoscopy). This is done to check for the earliest forms of colorectal cancer.  Routine  screening usually begins at age 41.  Direct examination of the colon should be repeated every 5-10 years through 24 years of age. However, you may need to be screened more often if early forms of precancerous polyps or small growths are found. Skin Cancer  Check your skin from head to toe regularly.  Tell your health care provider about any new moles or changes in moles, especially if there is a change in a mole's shape or color.  Also tell your health care provider if you have a mole that is larger than the size of a pencil eraser.  Always use sunscreen. Apply sunscreen liberally and repeatedly throughout the day.  Protect yourself by wearing long sleeves, pants, a wide-brimmed hat, and sunglasses whenever you are outside. Heart disease, diabetes, and high blood pressure  High blood pressure causes heart disease and increases the risk of stroke. High blood pressure is more likely to develop in:  People who have blood pressure in the high end of the normal range (130-139/85-89 mm Hg).  People who are overweight or obese.  People who are African American.  If you are 59-24 years of age, have your blood pressure checked every 3-5 years. If you are 34 years of age or older, have your blood pressure checked every year. You should have your blood pressure measured twice-once when you are at a hospital or clinic, and once when you are not at a hospital or clinic. Record the average of the two measurements. To check your blood pressure when you are not at a hospital or clinic, you can use:  An automated blood pressure machine at a pharmacy.  A home blood pressure monitor.  If you are between 29 years and 60 years old, ask your health care provider if you should take aspirin to prevent strokes.  Have regular diabetes screenings. This involves taking a blood sample to check your fasting blood sugar level.  If you are at a normal weight and have a low risk for diabetes, have this test once  every three years after 24 years of age.  If you are overweight and have a high risk for diabetes, consider being tested at a younger age or more often. Preventing infection Hepatitis B  If you have a higher risk for hepatitis B, you should be screened for this virus. You are considered at high risk for hepatitis B if:  You were born in a country where hepatitis B is common. Ask your health care provider which countries are considered high risk.  Your parents were born in a high-risk country, and you have not been immunized against hepatitis B (hepatitis B vaccine).  You have HIV or AIDS.  You use needles to inject street drugs.  You live with someone who has hepatitis B.  You have had sex with someone who has hepatitis B.  You get hemodialysis treatment.  You take certain medicines for conditions, including cancer, organ transplantation, and autoimmune conditions. Hepatitis C  Blood testing is recommended for:  Everyone born from [birth year] through 1963.  Anyone with known risk factors for hepatitis C. Sexually transmitted infections (STIs)  You should be screened for sexually transmitted infections (STIs) including gonorrhea and chlamydia if:  You are sexually active and are younger than 24 years of age.  You are older than 24 years of age and your health care provider tells you that you are at risk for this type of infection.  Your sexual activity has changed since you were last screened and you are at an increased risk for chlamydia or gonorrhea. Ask your health care provider if you are at risk.  If you do not have HIV, but are at risk, it may be recommended that you take a prescription medicine daily to prevent HIV infection. This is called pre-exposure prophylaxis (PrEP). You are considered at risk if:  You are sexually active and do not regularly use condoms or know the HIV status of your partner(s).  You take drugs by injection.  You are sexually active with a partner  who has HIV. Talk with your health care provider about whether you are at high risk of being infected with HIV. If you choose to begin PrEP, you should first be tested for HIV. You should then be tested every 3 months for as long as you are taking PrEP. Pregnancy  If you are premenopausal and you may become pregnant, ask your health care provider about preconception counseling.  If you may become pregnant, take 400 to 800 micrograms (mcg) of folic acid  every day.  If you want to prevent pregnancy, talk to your health care provider about birth control (contraception). Osteoporosis and menopause  Osteoporosis is a disease in which the bones lose minerals and strength with aging. This can result in serious bone fractures. Your risk for osteoporosis can be identified using a bone density scan.  If you are 4 years of age or older, or if you are at risk for osteoporosis and fractures, ask your health care provider if you should be screened.  Ask your health care provider whether you should take a calcium or vitamin D supplement to lower your risk for osteoporosis.  Menopause may have certain physical symptoms and risks.  Hormone replacement therapy may reduce some of these symptoms and risks. Talk to your health care provider about whether hormone replacement therapy is right for you. Follow these instructions at home:  Schedule regular health, dental, and eye exams.  Stay current with your immunizations.  Do not use any tobacco products including cigarettes, chewing tobacco, or electronic cigarettes.  If you are pregnant, do not drink alcohol.  If you are breastfeeding, limit how much and how often you drink alcohol.  Limit alcohol intake to no more than 1 drink per day for nonpregnant women. One drink equals 12 ounces of beer, 5 ounces of wine, or 1 ounces of hard liquor.  Do not use street drugs.  Do not share needles.  Ask your health care provider for help if you need support  or information about quitting drugs.  Tell your health care provider if you often feel depressed.  Tell your health care provider if you have ever been abused or do not feel safe at home. This information is not intended to replace advice given to you by your health care provider. Make sure you discuss any questions you have with your health care provider. Document Released: 01/14/2011 Document Revised: 12/07/2015 Document Reviewed: 04/04/2015 Elsevier Interactive Patient Education  2017 [employer].

## 2016-10-22 NOTE — Progress Notes (Signed)
BP 125/81   Pulse 85   Temp 97 F (36.1 C) (Oral)   Ht  (1.702 m)   Wt 241 lb (109.3 kg)   BMI 37.75 kg/m    Subjective:    Patient ID: Marisa Suarez, female    DOB: [birth year], 26 y.o.   MRN: [id number]  HPI: Marisa Suarez is a 26 y.o. female presenting on 10/22/2016 for Annual Exam  This patient comes in for annual well physical examination and pap collection. All medications are reviewed today, she has used the NuvaRing for 2 months without problems.  All of the medical conditions are reviewed and updated.  Lab work is reviewed and will be ordered as medically necessary. There are no new problems reported with today's visit.  Patient reports doing well overall.   Past Medical History:  Diagnosis Date  . Anemia    Relevant past medical, surgical, family and social history reviewed and updated as indicated. Interim medical history since our last visit reviewed. Allergies and medications reviewed and updated. DATA REVIEWED: CHART IN EPIC  Social History   Social History  . Marital status: Single    Spouse name: N/A  . Number of children: N/A  . Years of education: N/A   Occupational History  . Not on file.   Social History Main Topics  . Smoking status: Never Smoker  . Smokeless tobacco: Never Used  . Alcohol use Yes     Comment: seldom  . Drug use: No     Comment: used to use ( last use several months ago)  . Sexual activity: Yes    Birth control/ protection: None   Other Topics Concern  . Not on file   Social History Narrative  . No narrative on file    Past Surgical History:  Procedure Laterality Date  . CHOLECYSTECTOMY N/A 12/23/2014   Procedure: LAPAROSCOPIC CHOLECYSTECTOMY;  Surgeon: Franky Macho Md, MD;  Location: [facility];  Service: General;  Laterality: N/A;  . HERNIA REPAIR     umbilical    History reviewed. No pertinent family history.  Review of Systems  Constitutional: Negative.  Negative for activity change, fatigue and fever.  HENT:  Negative.   Eyes: Negative.   Respiratory: Negative.  Negative for cough.   Cardiovascular: Negative.  Negative for chest pain.  Gastrointestinal: Negative.  Negative for abdominal pain.  Endocrine: Negative.   Genitourinary: Negative.  Negative for dysuria.  Musculoskeletal: Negative.   Skin: Negative.   Neurological: Negative.     Allergies as of 10/22/2016      Reactions   Iohexol Cough   Patient began coughing after injection of contrast and had difficulty taking in a deep breath      Medication List       Accurate as of 10/22/16 11:01 AM. Always use your most recent med list.          etonogestrel-ethinyl estradiol 0.12-0.015 MG/24HR vaginal ring Commonly known as:  NUVARING Insert vaginally and leave in place for 3 consecutive weeks, then remove for 1 week.          Objective:    BP 125/81   Pulse 85   Temp 97 F (36.1 C) (Oral)   Ht  (1.702 m)   Wt 241 lb (109.3 kg)   BMI 37.75 kg/m   Allergies  Allergen Reactions  . Iohexol Cough    Patient began coughing after injection of contrast and had difficulty taking in a deep breath  Wt Readings from Last 3 Encounters:  10/22/16 241 lb (109.3 kg)  08/12/16 242 lb (109.8 kg)  12/23/14 220 lb (99.8 kg)    Physical Exam  Constitutional: She is oriented to person, place, and time. She appears well-developed and well-nourished.  HENT:  Head: Normocephalic and atraumatic.  Right Ear: Tympanic membrane, external ear and ear canal normal.  Left Ear: Tympanic membrane, external ear and ear canal normal.  Nose: Nose normal. No rhinorrhea.  Mouth/Throat: Oropharynx is clear and moist and mucous membranes are normal. No oropharyngeal exudate or posterior oropharyngeal erythema.  Eyes: Conjunctivae and EOM are normal. Pupils are equal, round, and reactive to light.  Neck: Normal range of motion. Neck supple.  Cardiovascular: Normal rate, regular rhythm, normal heart sounds and intact distal pulses.     Pulmonary/Chest: Effort normal and breath sounds normal. Right breast exhibits no mass, no skin change and no tenderness. Left breast exhibits no mass, no skin change and no tenderness. Breasts are symmetrical.  Abdominal: Soft. Bowel sounds are normal.  Genitourinary: Vagina normal and uterus normal. Rectal exam shows no fissure. No breast swelling, tenderness, discharge or bleeding. There is no tenderness or lesion on the right labia. There is no tenderness or lesion on the left labia. Uterus is not deviated, not enlarged and not tender. Cervix exhibits no motion tenderness, no discharge and no friability. Right adnexum displays no mass, no tenderness and no fullness. Left adnexum displays no mass, no tenderness and no fullness. No tenderness or bleeding in the vagina. No vaginal discharge found.  Neurological: She is alert and oriented to person, place, and time. She has normal reflexes.  Skin: Skin is warm and dry. No rash noted.  Psychiatric: She has a normal mood and affect. Her behavior is normal. Judgment and thought content normal.        Assessment & Plan:   1. Well female exam with routine gynecological exam  2. Encounter for surveillance of vaginal ring hormonal contraceptive device - etonogestrel-ethinyl estradiol (NUVARING) 0.12-0.015 MG/24HR vaginal ring; Insert vaginally and leave in place for 3 consecutive weeks, then remove for 1 week.  Dispense: 1 each; Refill: 12   Continue all other maintenance medications as listed above.  Follow up plan: Return in about 1 year (around 10/22/2017) for well exam and PAP.  Educational handout given for health maintenance  Remus Loffler PA-C [facility] [address]  [place], [place] [address] [phone]   10/22/2016, 11:01 AM

## 2016-10-22 NOTE — Addendum Note (Signed)
Addended by: Tamera Punt on: 10/22/2016 11:11 AM   Modules accepted: Orders

## 2016-10-24 LAB — PAP IG, CT-NG, RFX HPV ASCU
CHLAMYDIA, NUC. ACID AMP: NEGATIVE
GONOCOCCUS BY NUCLEIC ACID AMP: NEGATIVE
PAP SMEAR COMMENT: 0

## 2017-06-18 ENCOUNTER — Telehealth: Payer: Self-pay | Admitting: Family

## 2017-06-27 NOTE — Telephone Encounter (Signed)
Left message to call back.  Spoke with Dr. Nadine CountsGottschalk and she can take care of this, but patient would need to come in for consult before appointment.

## 2017-07-02 NOTE — Telephone Encounter (Signed)
Mirena IUD has been ordered and delivered to our office. Left patient a message to call me and schedule a visit with Dr. Nadine CountsGottschalk for a consult appointment to discuss the mirena. After this appointment she will need to call back and make an appointment when she starts her menstrual cycle and schedule the insertion with Dr Nadine CountsGottschalk.

## 2017-07-10 NOTE — Telephone Encounter (Signed)
Attempted to contact patient - NA. This encounter will be closed 

## 2018-03-12 ENCOUNTER — Ambulatory Visit: Payer: 59 | Admitting: Nurse Practitioner

## 2018-03-12 ENCOUNTER — Ambulatory Visit (INDEPENDENT_AMBULATORY_CARE_PROVIDER_SITE_OTHER): Payer: 59

## 2018-03-12 ENCOUNTER — Encounter: Payer: Self-pay | Admitting: Nurse Practitioner

## 2018-03-12 VITALS — BP 114/77 | HR 88 | Temp 98.7°F | Ht 67.0 in | Wt 232.0 lb

## 2018-03-12 DIAGNOSIS — M7750 Other enthesopathy of unspecified foot: Secondary | ICD-10-CM

## 2018-03-12 DIAGNOSIS — M79671 Pain in right foot: Secondary | ICD-10-CM | POA: Diagnosis not present

## 2018-03-12 DIAGNOSIS — M779 Enthesopathy, unspecified: Secondary | ICD-10-CM

## 2018-03-12 MED ORDER — NAPROXEN 500 MG PO TABS: 500.0000 mg | ORAL_TABLET | Freq: Two times a day (BID) | ORAL | 1 refills | Status: AC

## 2018-03-12 MED ORDER — DICLOFENAC SODIUM 1 % TD GEL: 2.0000 g | Freq: Four times a day (QID) | TRANSDERMAL | 1 refills | Status: AC

## 2018-03-12 NOTE — Progress Notes (Signed)
   Subjective:    Patient ID: Marisa Suarez, female    DOB: April 26, 1991, 27 y.o.   MRN: [id number]   Chief Complaint: Foot Pain (Right foot. No injury)   HPI Patient comes in c/o right foot pain. Started about 2 days ago. Pain is mainly on the lateral side. She denies any injury.  Rate [place] a 6/10. When she is off of it for awhile then tries to get up is when the pain is the worse.    Review of Systems  Respiratory: Negative.   Cardiovascular: Negative.   Musculoskeletal: Positive for arthralgias (lateral side of right foot).  Neurological: Negative.   Psychiatric/Behavioral: Negative.   All other systems reviewed and are negative.      Objective:   Physical Exam  Constitutional: She is oriented to person, place, and time. She appears well-developed and well-nourished.  Cardiovascular: Normal rate.  Pulmonary/Chest: Effort normal.  Musculoskeletal:  Pain on posterior lateral side of right foot. No edema FROM of ankle and foot with slight pain on inversion.  Neurological: She is alert and oriented to person, place, and time.  Skin: Skin is dry.  Psychiatric: She has a normal mood and affect. Her behavior is normal. Thought content normal.   BP 114/77   Pulse 88   Temp 98.7 F (37.1 C) (Oral)   Ht 5\' 7"  (1.702 m)   Wt 232 lb (105.2 kg)   BMI 36.34 kg/m   Right foot xray negative     Assessment & Plan:  Marisa Suarez in today with chief complaint of Foot Pain (Right foot. No injury)   1. Right foot pain - DG Foot Complete Right; Future  2. Tendonitis of foot Ice bid Rest Wear good shoes when standing RTO prn - naproxen (NAPROSYN) 500 MG tablet; Take 1 tablet (500 mg total) by mouth 2 (two) times daily with a meal.  Dispense: 60 tablet; Refill: 1 - diclofenac sodium (VOLTAREN) 1 % GEL; Apply 2 g topically 4 (four) times daily.  Dispense: 5 Tube; Refill: 1  Mary-Margaret Daphine DeutscherMartin, FNP

## 2018-03-12 NOTE — Patient Instructions (Signed)
Tendinitis Tendinitis is inflammation of a tendon. A tendon is a strong cord of tissue that connects muscle to bone. Tendinitis can affect any tendon, but it most commonly affects the shoulder tendon (rotator cuff), ankle tendon (Achilles tendon), elbow tendon (triceps tendon), or one of the tendons in the wrist. What are the causes? This condition may be caused by:  Overusing a tendon or muscle. This is common.  Age-related wear and tear.  Injury.  Inflammatory conditions, such as arthritis.  Certain medicines.  What increases the risk? This condition is more likely to develop in people who do activities that involve repetitive motions. What are the signs or symptoms? Symptoms of this condition may include:  Pain.  Tenderness.  Mild swelling.  How is this diagnosed? This condition is diagnosed with a physical exam. You may also have tests, such as:  Ultrasound. This uses sound waves to make an image of your affected area.  MRI.  How is this treated? This condition may be treated by resting, icing, applying pressure (compression), and raising (elevating) the area above the level of your heart. This is known as RICE therapy. Treatment may also include:  Medicines to help reduce inflammation or to help reduce pain.  Exercises or physical therapy to strengthen and stretch the tendon.  A brace or splint.  Surgery (rare).  Follow these instructions at home:  If you have a splint or brace:  Wear the splint or brace as told by your health care provider. Remove it only as told by your health care provider.  Loosen the splint or brace if your fingers or toes tingle, become numb, or turn cold and blue.  Do not take baths, swim, or use a hot tub until your health care provider approves. Ask your health care provider if you can take showers. You may only be allowed to take sponge baths for bathing.  Do not let your splint or brace get wet if it is not waterproof. ? If your  splint or brace is not waterproof, cover it with a watertight plastic bag when you take a bath or a shower.  Keep the splint or brace clean. Managing pain, stiffness, and swelling  If directed, apply ice to the affected area. ? Put ice in a plastic bag. ? Place a towel between your skin and the bag. ? Leave the ice on for 20 minutes, 2-3 times a day.  If directed, apply heat to the affected area as often as told by your health care provider. Use the heat source that your health care provider recommends, such as a moist heat pack or a heating pad. ? Place a towel between your skin and the heat source. ? Leave the heat on for 20-30 minutes. ? Remove the heat if your skin turns bright red. This is especially important if you are unable to feel pain, heat, or cold. You may have a greater risk of getting burned.  Move the fingers or toes of the affected limb often, if this applies. This can help to prevent stiffness and lessen swelling.  If directed, elevate the affected area above the level of your heart while you are sitting or lying down. Driving  Do not drive or operate heavy machinery while taking prescription pain medicine.  Ask your health care provider when it is safe to drive if you have a splint or brace on any part of your arm or leg. Activity  Return to your normal activities as told by your health care   provider. Ask your health care provider what activities are safe for you.  Rest the affected area as told by your health care provider.  Avoid using the affected area while you are experiencing symptoms of tendinitis.  Do exercises as told by your health care provider. General instructions  If you have a splint, do not put pressure on any part of the splint until it is fully hardened. This may take several hours.  Wear an elastic bandage or compression wrap only as told by your health care provider.  Take over-the-counter and prescription medicines only as told by your  health care provider.  Keep all follow-up visits as told by your health care provider. This is important. Contact a health care provider if:  Your symptoms do not improve.  You develop new, unexplained problems, such as numbness in your hands. This information is not intended to replace advice given to you by your health care provider. Make sure you discuss any questions you have with your health care provider. Document Released: 06/28/2000 Document Revised: 02/29/2016 Document Reviewed: 04/03/2015 [employer] Interactive Patient Education  2018 [employer]  

## 2020-03-18 IMAGING — DX DG FOOT COMPLETE 3+V*R*
3 series · 3 of 3 positions shown · non-contrast
Comparison: None.

CLINICAL DATA: Cramping sensation.

EXAM:
RIGHT FOOT COMPLETE - 3+ VIEW

[foot ap]
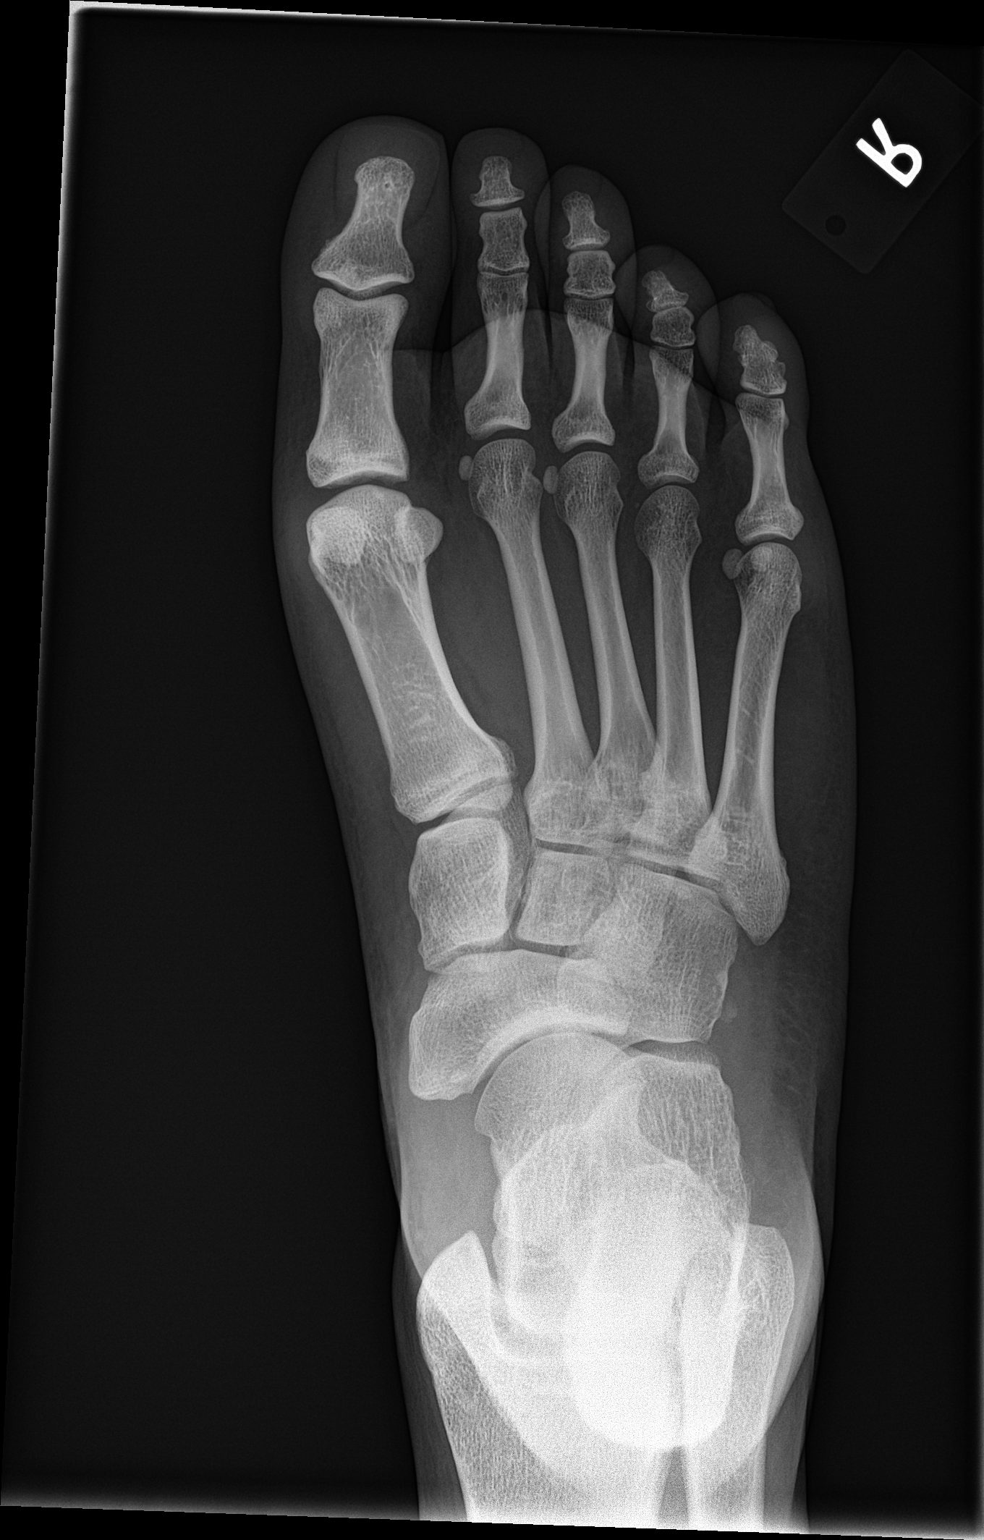

[foot obl]
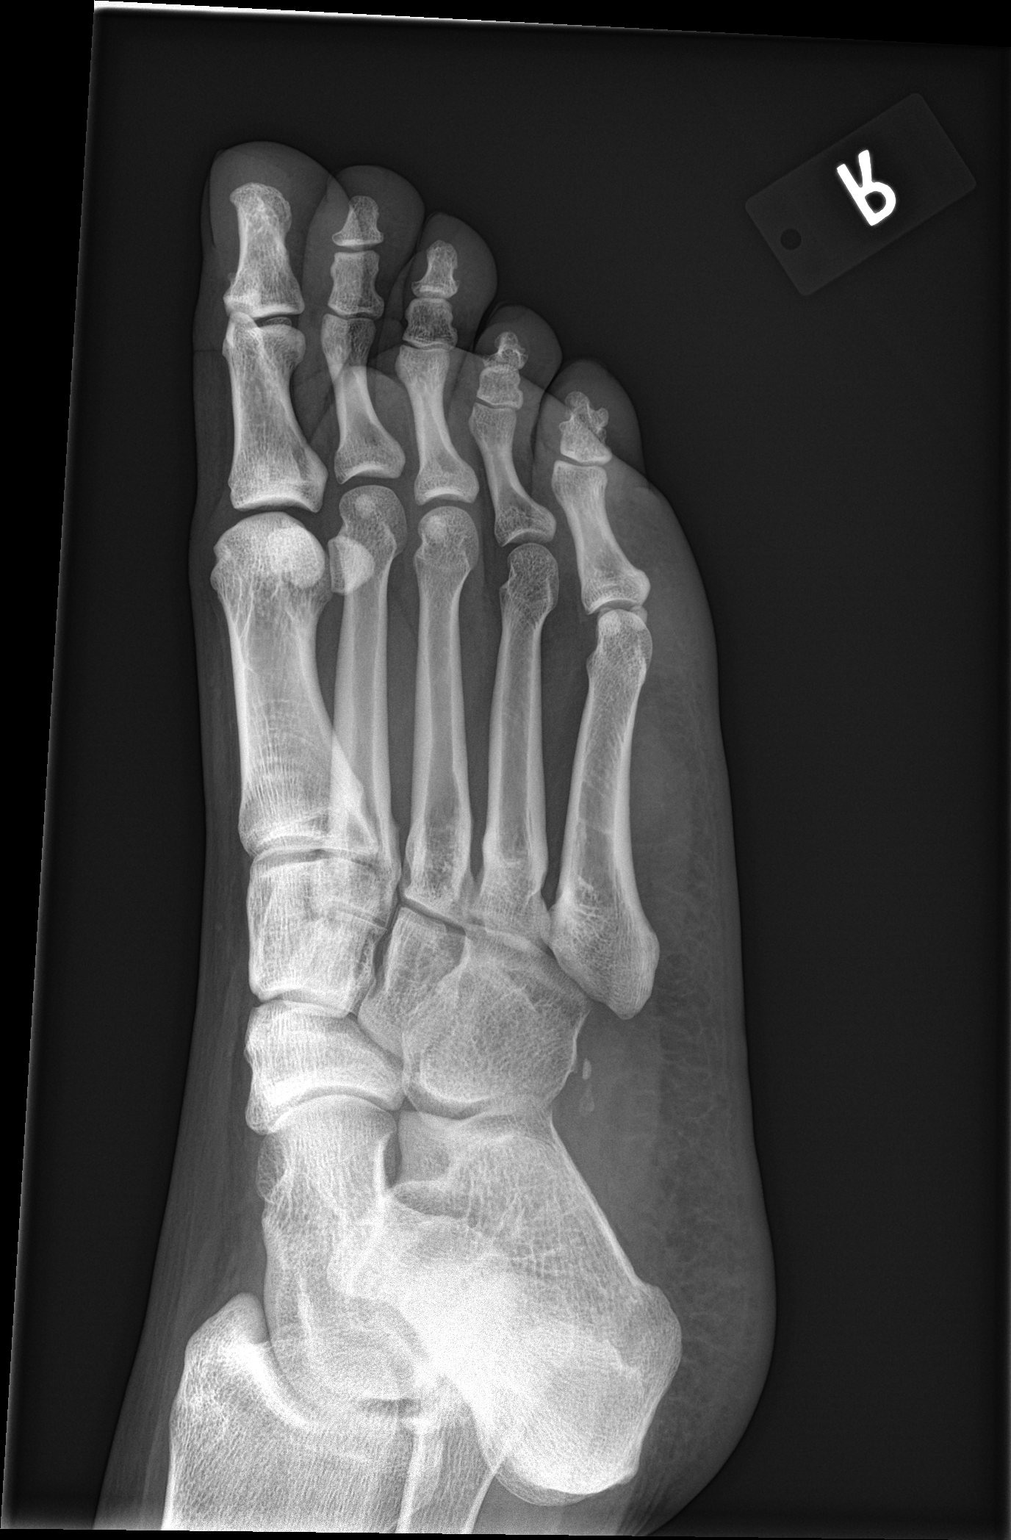

[foot lat]
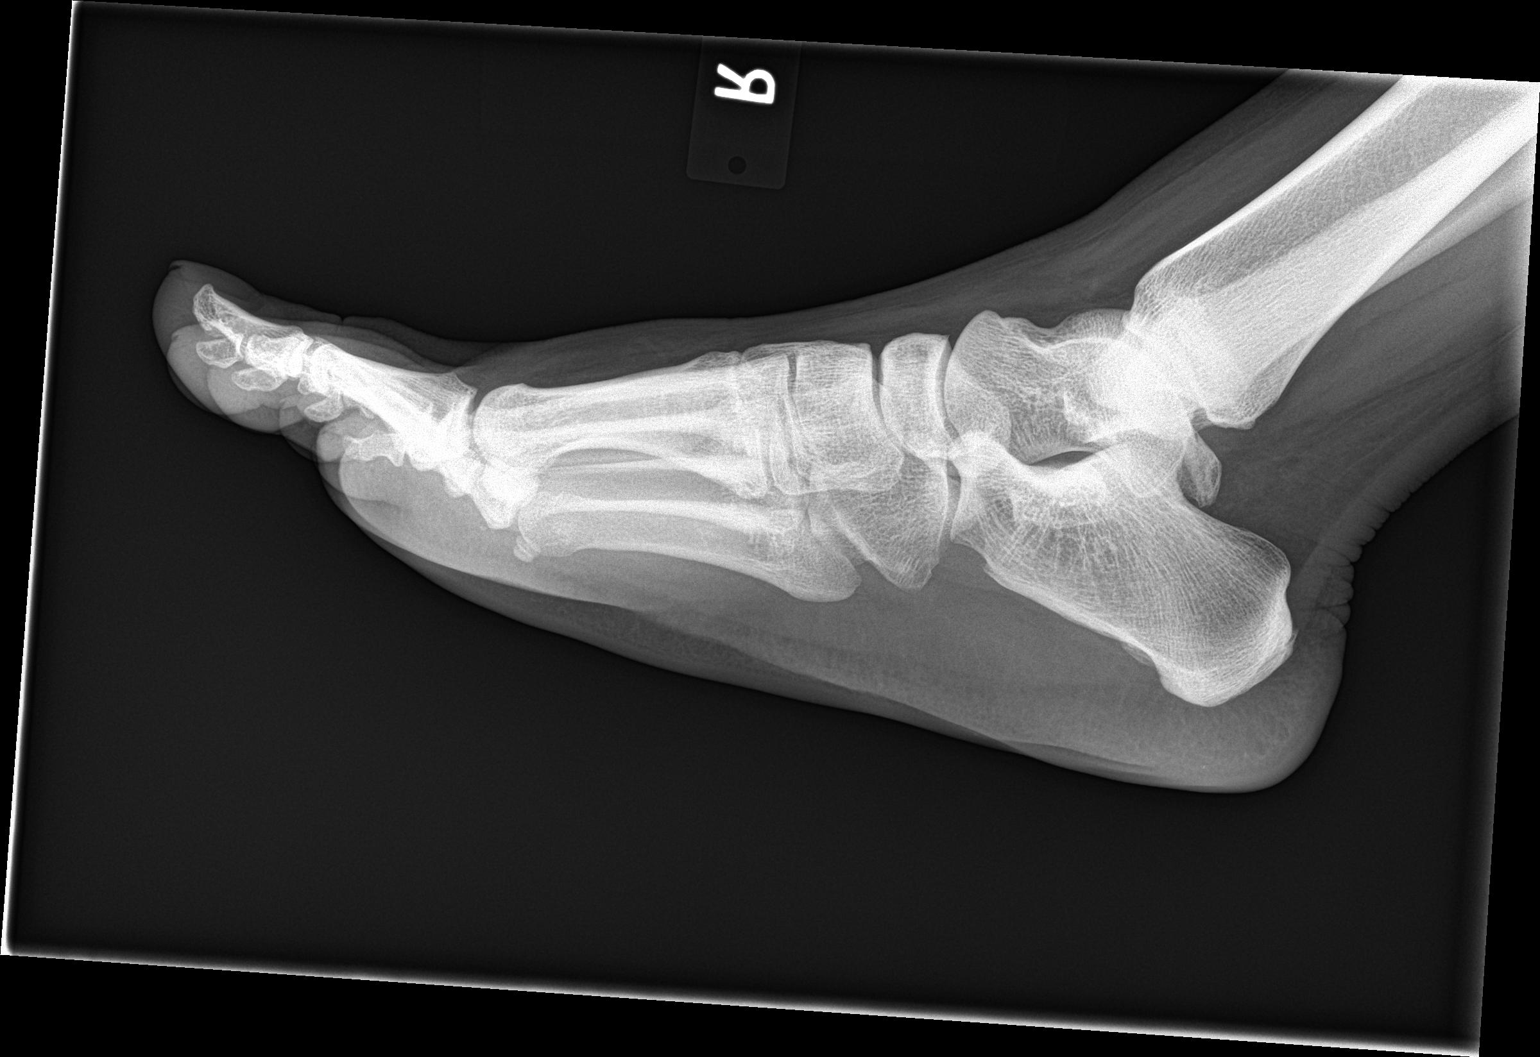

[3 of 3 positions shown; findings below may reference images not displayed]

FINDINGS: There is no evidence of fracture or dislocation. There is no
evidence of arthropathy or other focal bone abnormality. Numerous
small distal metatarsal accessory ossicles, normal variant. Soft
tissues are unremarkable.
IMPRESSION: Normal.
